# Patient Record
Sex: Female | Born: 2000
Health system: Southern US, Community
[De-identification: ages and names within clinical notes are randomized; demographics above are authoritative.]

## PROBLEM LIST (undated history)

## (undated) ENCOUNTER — Inpatient Hospital Stay (HOSPITAL_COMMUNITY): Payer: Self-pay

## (undated) DIAGNOSIS — O24419 Gestational diabetes mellitus in pregnancy, unspecified control: Secondary | ICD-10-CM

## (undated) DIAGNOSIS — L309 Dermatitis, unspecified: Secondary | ICD-10-CM

## (undated) DIAGNOSIS — F419 Anxiety disorder, unspecified: Secondary | ICD-10-CM

## (undated) DIAGNOSIS — F32A Depression, unspecified: Secondary | ICD-10-CM

## (undated) DIAGNOSIS — F429 Obsessive-compulsive disorder, unspecified: Secondary | ICD-10-CM

## (undated) HISTORY — DX: Dermatitis, unspecified: L30.9

## (undated) HISTORY — PX: NO PAST SURGERIES: SHX2092

## (undated) HISTORY — DX: Gestational diabetes mellitus in pregnancy, unspecified control: O24.419

---
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
## 2019-01-10 ENCOUNTER — Ambulatory Visit (INDEPENDENT_AMBULATORY_CARE_PROVIDER_SITE_OTHER): Payer: BC Managed Care – PPO | Admitting: Pediatric Endocrinology

## 2019-01-10 ENCOUNTER — Encounter (INDEPENDENT_AMBULATORY_CARE_PROVIDER_SITE_OTHER): Payer: Self-pay | Admitting: Pediatric Endocrinology

## 2019-01-10 ENCOUNTER — Other Ambulatory Visit: Payer: Self-pay

## 2019-01-10 VITALS — BP 122/76 | HR 90 | Wt 255.0 lb

## 2019-01-10 DIAGNOSIS — R7309 Other abnormal glucose: Secondary | ICD-10-CM | POA: Diagnosis not present

## 2019-01-10 DIAGNOSIS — Z8742 Personal history of other diseases of the female genital tract: Secondary | ICD-10-CM | POA: Diagnosis not present

## 2019-01-10 DIAGNOSIS — L83 Acanthosis nigricans: Secondary | ICD-10-CM

## 2019-01-10 DIAGNOSIS — N946 Dysmenorrhea, unspecified: Secondary | ICD-10-CM

## 2019-01-10 LAB — POCT GLUCOSE (DEVICE FOR HOME USE): Glucose Fasting, POC: 90 mg/dL (ref 70–99)

## 2019-01-10 LAB — POCT GLYCOSYLATED HEMOGLOBIN (HGB A1C): Hemoglobin A1C: 5.3 % (ref 4.0–5.6)

## 2019-01-10 NOTE — Patient Instructions (Signed)
Work on [employer]arry [employer] every day- you should aim to increase your heart rate multiple times a day   Try to limit sweet drinks to 1 per week.   Try to limit fast food to 1 per week   Learn to grill.

## 2019-01-10 NOTE — Progress Notes (Signed)
Subjective:  Subjective  Patient Name: Jackie Watkins See Date of Birth: June 15, 2001  MRN: [id number]  Jackie Watkins Schamp  presents to the office today for follow up evaluation and management of her primary amenorrhea  HISTORY OF PRESENT ILLNESS:   Jackie Watkins is a 18 y.o. AA female   Jackie Watkins was accompanied by her self  1. Jackie Watkins was seen by her PCP in September 2018 for her 16 year wcc. At that visit they discussed that she still had not started her period. She had LH/FSH labs checked which were normal at 7.1 and 6.2 respectively. TSH was normal as well. She was referred to endocrinology for further evaluation and management.    2. Jackie Watkins was last seen in [facility] on 07/12/18. In the interim she has been generally healthy.   She has continued to have regular menses since her last visit. She started a period today. She says that it is coming about once a month. It is still really heavy and painful. She is not taking Advil or motrin. She tries not to take too much medicine. She feels that she can deal with it.   She thinks that after her last visit she was doing a better job of caring for her body. She was going to the gym and exercising. With the pandemic and the closure of all the gyms she feels that she has struggled to maintain her goals. She is less active. She feels that she is starting to eat more at night. She feels that she is struggling with food choices. She notes that when she has her period she craves sweets or hot fries.   She dropped 2 sizes in her pants. She had to buy new shorts compared with last year.   She has not been doing any jumping jacks. She did 50 lunge jacks in clinic today. She did 70 jumping jacks (with breaks) at her last visit.    She is drinking water and some "pop". Mostly just water. She is getting fast food or carry out about twice a week. She usually gets a sweet tea.   Her family is still bringing a lot of healthy snacks in the house. Since they are all at home all day  her family has not been providing 'junk food".   Energy level is good.   She feels that her body hair is "way better". She is no longer getting extra hair on her face or chest.    3. Pertinent Review of Systems:  Constitutional: The patient feels "over emotional". The patient seems healthy and active.  Eyes: Vision seems to be good. There are no recognized eye problems. Neck: The patient has no complaints of anterior neck swelling, soreness, tenderness, pressure, discomfort, or difficulty swallowing.   Heart: Heart rate increases with exercise or other physical activity. The patient has no complaints of palpitations, irregular heart beats, chest pain, or chest pressure.   Lungs: asthma. Last rescue inhaler 1-2 weeks ago.  On Advair for maintenance- but not using regularly. No recent steroids. Not taking her Advair as prescribed.  Gastrointestinal: Bowel movents seem normal. The patient has no complaints of excessive hunger, acid reflux, upset stomach, stomach aches or pains, diarrhea, or constipation.  Legs: Muscle mass and strength seem normal. There are no complaints of numbness, tingling, burning, or pain. No edema is noted.  Feet: There are no obvious foot problems. There are no complaints of numbness, tingling, burning, or pain. No edema is noted. Neurologic: There are no recognized problems with  muscle movement and strength, sensation, or coordination. GYN/GU:  Menarche July 2019 (age 18). LMP 01/10/19  Getting a cycle about once a month.   PAST MEDICAL, FAMILY, AND SOCIAL HISTORY  Past Medical History:  Diagnosis Date  . Asthma   . Eczema     No family history on file.   Current Outpatient Medications:  .  albuterol (PROVENTIL HFA;VENTOLIN HFA) 108 (90 BASE) MCG/ACT inhaler, Inhale 2 puffs into the lungs every 6 (six) hours as needed. Shortness of breath/wheezing , Disp: , Rfl:  .  Fluticasone-Salmeterol (ADVAIR) 100-50 MCG/DOSE AEPB, Inhale 1 puff into the lungs every 12  (twelve) hours.  , Disp: , Rfl:  .  medroxyPROGESTERone (PROVERA) 10 MG tablet, Take 1 tablet (10 mg total) by mouth daily. (Patient not taking: Reported on 03/18/2018), Disp: 10 tablet, Rfl: 0 .  Multiple Vitamins-Minerals (EQL AIR PROTECTOR) TBEF, Take by mouth., Disp: , Rfl:   Allergies as of 01/10/2019  . (No Known Allergies)     reports that she is a non-smoker but has been exposed to tobacco smoke. She has never used smokeless tobacco. Pediatric History  Patient Parents  . Chamorro,Erica (Mother)  . Study,Tyrone (Father)   Other Topics Concern  . Not on file  Social History Narrative   Lives at home with step mom, dad, sister and two brothers attends [employer] is in the 11th grade     1. School and Family:  [employer]. Lives with dad, step mom,sister and 2 brothers. Bio mom is battling addiction.  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  2. Activities: not active. Feels that her asthma prevents her. Has had fewer asthma attacks since starting jumping jacks. 3. Primary Care Provider: Scifres, Dorothy, PA-C  ROS: There are no other significant problems involving Jeanice's other body systems.    Objective:  Objective  Vital Signs:  BP 122/76   Pulse 90   Wt 255 lb (115.7 kg)   BMI 38.51 kg/m    Ht Readings from Last 3 Encounters:  07/12/18 5' 8.23" (1.733 m) (94 %, Z= 1.57)*  03/18/18 5' 7.91" (1.725 m) (93 %, Z= 1.45)*  12/15/17 5' 8.7" (1.745 m) (96 %, Z= 1.77)*   * Growth percentiles are based on CDC (Girls, 2-20 Years) data.   Wt Readings from Last 3 Encounters:  01/10/19 255 lb (115.7 kg) (>99 %, Z= 2.50)*  07/12/18 258 lb 6.4 oz (117.2 kg) (>99 %, Z= 2.51)*  03/18/18 268 lb (121.6 kg) (>99 %, Z= 2.56)*   * Growth percentiles are based on CDC (Girls, 2-20 Years) data.   HC Readings from Last 3 Encounters:  No [facility]    PHYSICAL EXAM:  Constitutional: The patient appears healthy and well nourished. The patient's  height and weight are advanced for age. She has lost 3 pounds since last visit.  Head: The head is normocephalic. Face: The face appears normal. There are no obvious dysmorphic features. Eyes: The eyes appear to be normally formed and spaced. Gaze is conjugate. There is no obvious arcus or proptosis. Moisture appears normal. Ears: The ears are normally placed and appear externally normal. Mouth: The oropharynx and tongue appear normal. Dentition appears to be delayed for age. She still has some primary teeth.  Oral moisture is normal. Neck: The neck appears to be visibly normal.  The thyroid gland is 18 grams in size. The consistency of the thyroid gland is firm. The thyroid gland is not tender to  palpation. Trace acanthosis  Lungs: The lungs are clear to auscultation. Air movement is good. Heart: Heart rate and rhythm are regular. Heart sounds S1 and S2 are normal. I did not appreciate any pathologic cardiac murmurs. Abdomen: The abdomen appears to be enlarged in size for the patient's age. Bowel sounds are normal. There is no obvious hepatomegaly, splenomegaly, or other mass effect.  Arms: Muscle size and bulk are normal for age. Axillary acanthosis Hands: There is no obvious tremor. Phalangeal and metacarpophalangeal joints are normal. Palmar muscles are normal for age. Palmar skin is normal. Palmar moisture is also normal. Legs: Muscles appear normal for age. No edema is present. Feet: Feet are normally formed. Dorsalis pedal pulses are normal. Neurologic: Strength is normal for age in both the upper and lower extremities. Muscle tone is normal. Sensation to touch is normal in both the legs and feet.   Puberty:  Tanner stage breast/genital IV.  LAB DATA:   Results for orders placed or performed in visit on 01/10/19 (from the past 672 hour(s))  POCT Glucose (Device for Home Use)   Collection Time: 01/10/19  9:20 AM  Result Value Ref Range   Glucose Fasting, POC 90 70 - 99 mg/dL   POC  Glucose    POCT glycosylated hemoglobin (Hb A1C)   Collection Time: 01/10/19  9:31 AM  Result Value Ref Range   Hemoglobin A1C 5.3 4.0 - 5.6 %   HbA1c POC (<> result, manual entry)     HbA1c, POC (prediabetic range)     HbA1c, POC (controlled diabetic range)        A1C 5.8 -> 6.1 -> 5.7-> 5.4% -> 5.5% ->5.3  Assessment and Plan:  Assessment  ASSESSMENT: Saje is a 18 y.o. AA female referred for primary amenorrhea. She also has prediabetes.   Primary amenorrhea  Since her second provera challenge she has continued to have regular cycles.  They have continued to be heavy. Discussed options with pads, tampons, menstrual cups She still does not feel that they are severe enough to warrant starting OCP for regulation.   Pre diabetes  Since last visit she has struggled with her lifestyle interventions under quarantine Discussed options for exercise at home including options for working with light free weights She is still doing well with food/drink choices- but feels that it is more of a challenge. Discussed limiting sweet drinks and fast food to no more that 1x per week She has had continued weight loss and improved acanthosis  She is not longer having post prandial hyperphagia Her A1C is stable in the normal range  Morbid childhood obesity  She has lost another 3 pounds since last visit.    PLAN:  1. Diagnostic: A1C as above.  2. Therapeutic: Lifestyle changes.  3. Patient education: Lengthy discussion of the above. Questions answered.  4. Follow-up: Return in about 1 year (around 01/10/2020).      Lelon Huh, MD  Level of Service: This visit lasted in excess of 25 minutes. More than 50% of the visit was devoted to counseling.    Patient referred by Scifres, Earlie Server, PA-C for primary amenorrhea/ prediabetes  Copy of this note sent to Scifres, Earlie Server, PA-C

## 2019-01-26 DIAGNOSIS — J029 Acute pharyngitis, unspecified: Secondary | ICD-10-CM | POA: Diagnosis not present

## 2019-01-27 DIAGNOSIS — J029 Acute pharyngitis, unspecified: Secondary | ICD-10-CM | POA: Diagnosis not present

## 2019-03-23 DIAGNOSIS — Z0000 Encounter for general adult medical examination without abnormal findings: Secondary | ICD-10-CM | POA: Diagnosis not present

## 2019-03-23 DIAGNOSIS — Z113 Encounter for screening for infections with a predominantly sexual mode of transmission: Secondary | ICD-10-CM | POA: Diagnosis not present

## 2019-06-21 DIAGNOSIS — U071 COVID-19: Secondary | ICD-10-CM | POA: Diagnosis not present

## 2019-06-21 DIAGNOSIS — R430 Anosmia: Secondary | ICD-10-CM | POA: Diagnosis not present

## 2019-06-21 DIAGNOSIS — J069 Acute upper respiratory infection, unspecified: Secondary | ICD-10-CM | POA: Diagnosis not present

## 2019-06-21 DIAGNOSIS — Z20828 Contact with and (suspected) exposure to other viral communicable diseases: Secondary | ICD-10-CM | POA: Diagnosis not present

## 2019-06-21 DIAGNOSIS — Z3202 Encounter for pregnancy test, result negative: Secondary | ICD-10-CM | POA: Diagnosis not present

## 2019-06-27 IMAGING — US US PELVIS COMPLETE
2 series · 14 of 25 positions shown · non-contrast
Comparison: None.

CLINICAL DATA: Initial evaluation for primary amenorrhea.

EXAM:
TRANSABDOMINAL ULTRASOUND OF PELVIS
TECHNIQUE: Transabdominal ultrasound examination of the pelvis was performed
including evaluation of the uterus, ovaries, adnexal regions, and
pelvic cul-de-sac.

[Series 1: us pelvis complete · 0.25mm/px · 13 of 28 slices shown (1 of 2)]
[im 1/28]
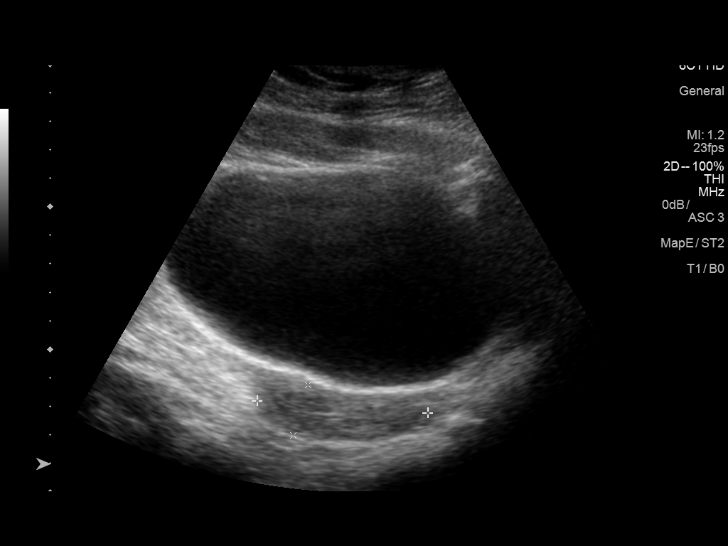
[im 3/28]
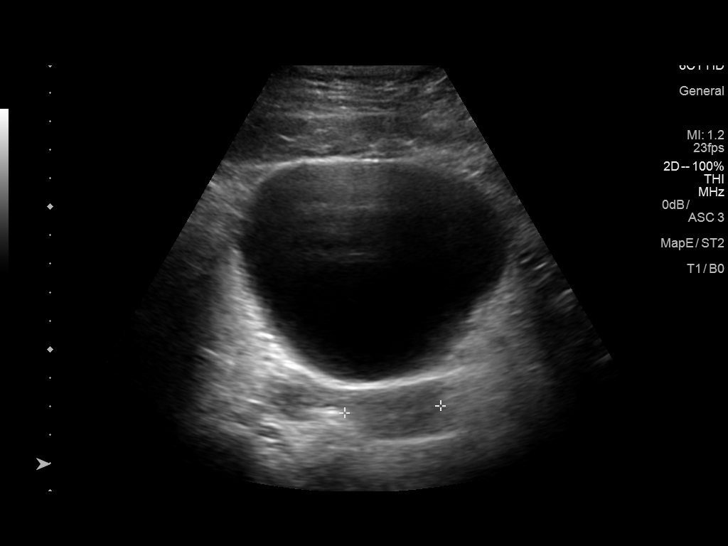
[im 5/28]
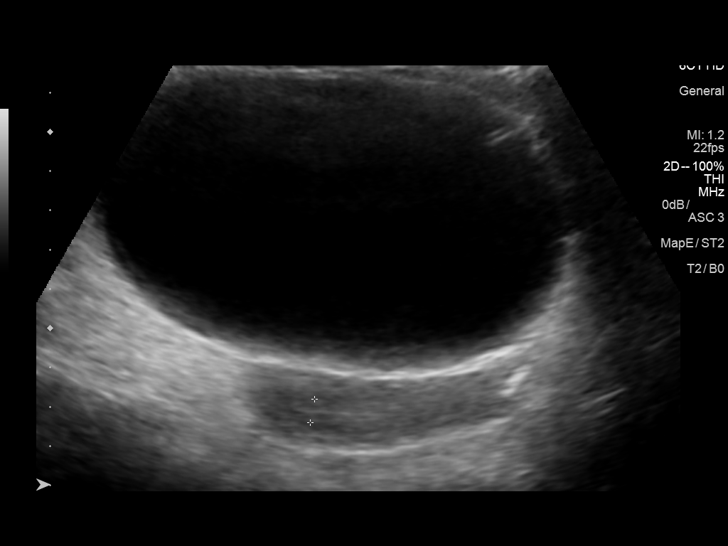
[im 8/28]
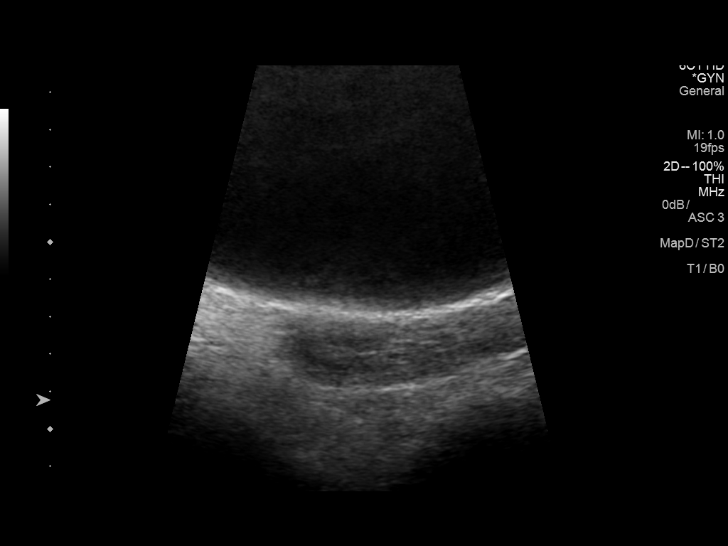
[im 10/28]
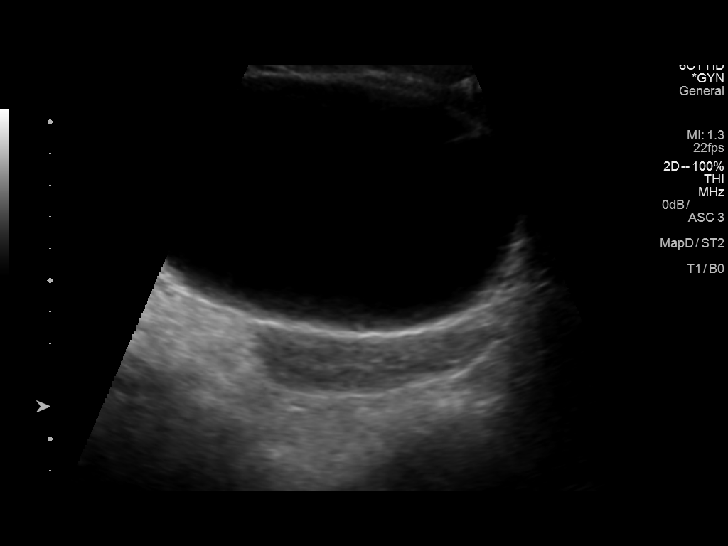
[im 12/28]
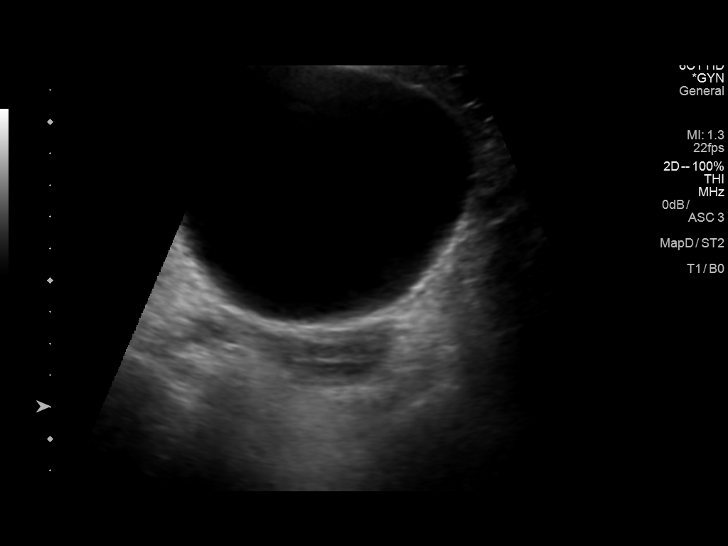
[im 14/28]
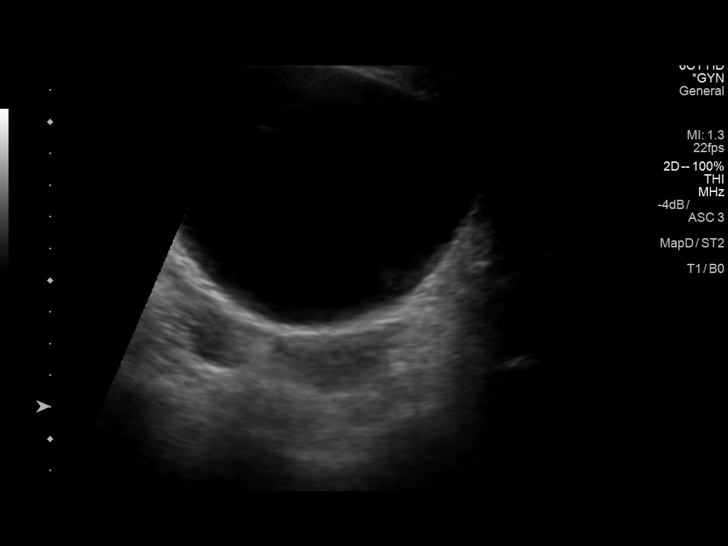
[im 16/28]
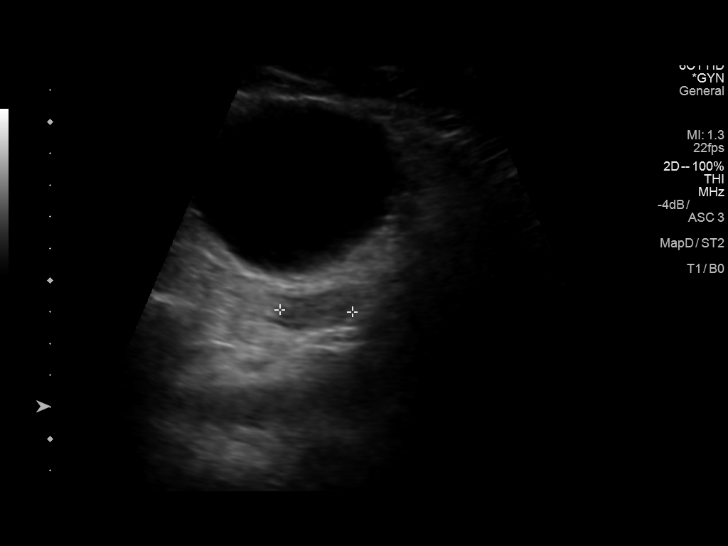
[im 19/28]
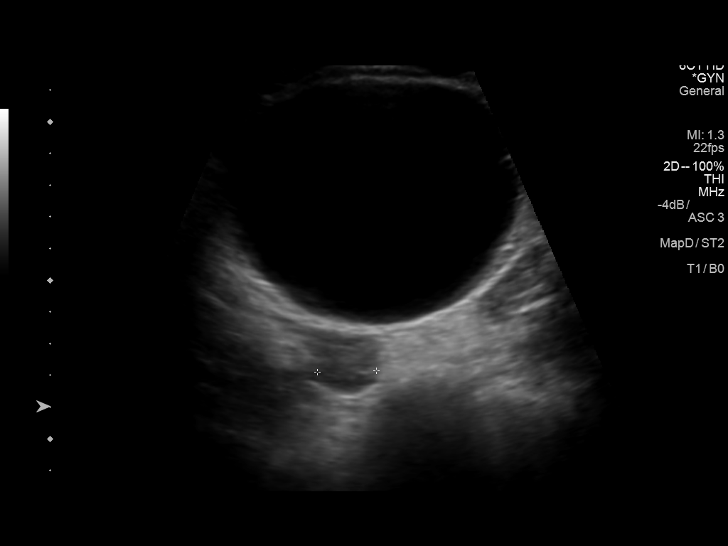
[im 20/28]
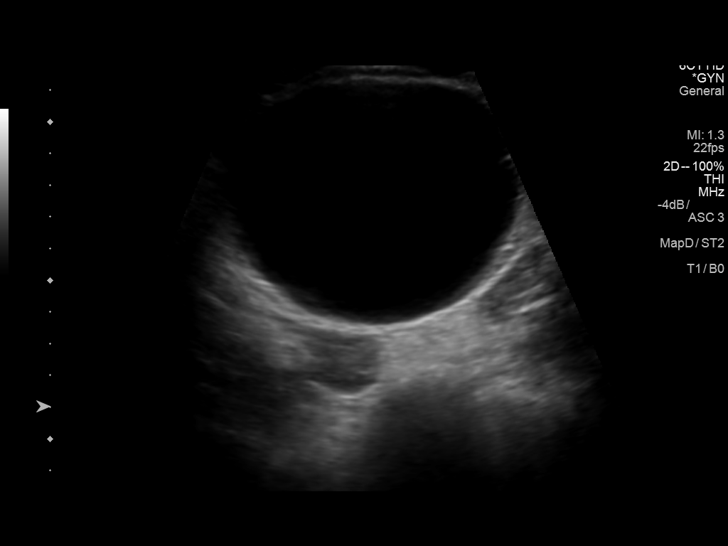
[im 23/28]
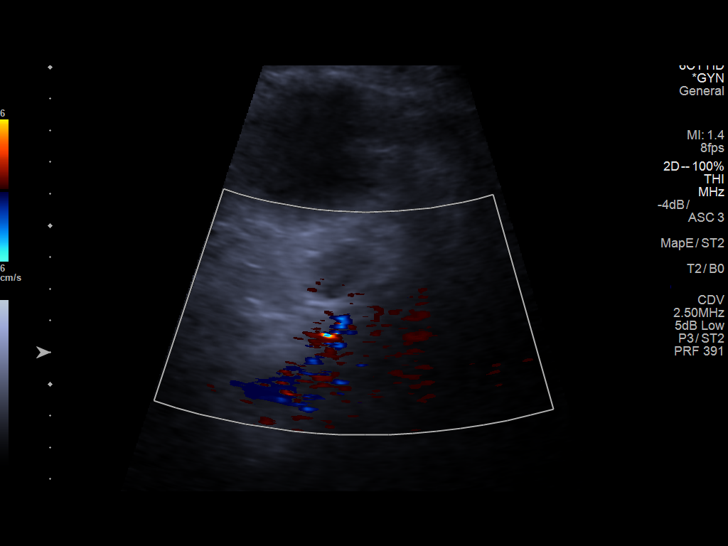
[im 25/28]
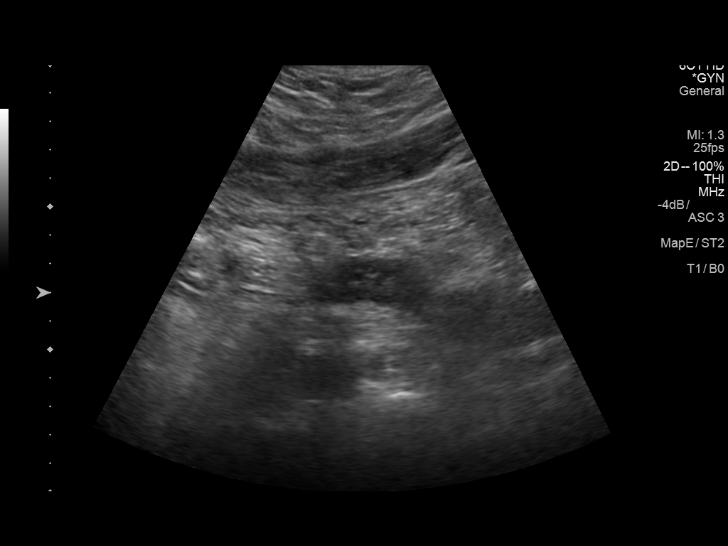
[im 28/28]
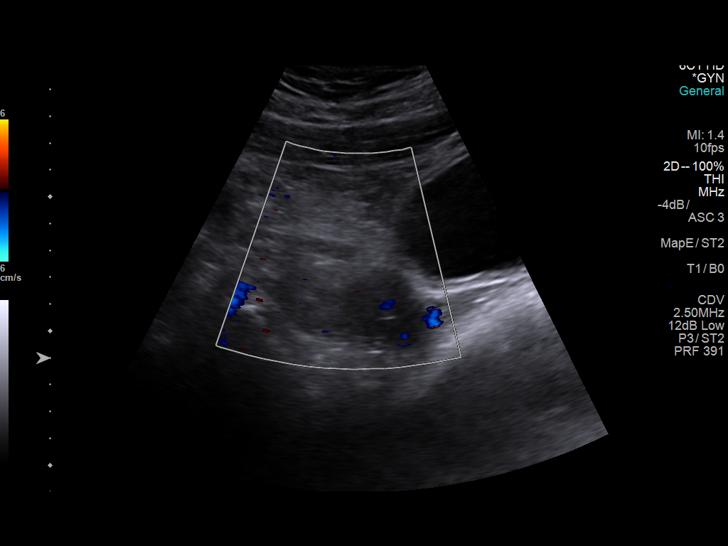

[Series 2: us pelvis complete · 0.25mm/px · 1 of 2 slices shown (2 of 2)]
[im 2/2]
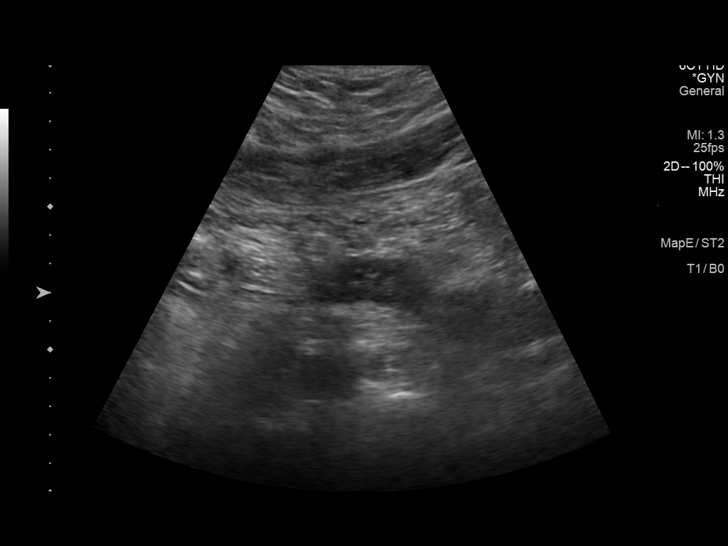

[14 of 25 positions shown; findings below may reference images not displayed]

FINDINGS: Uterus

Measurements: 6.0 x 1.8 x 3.4 cm. No fibroids or other mass
visualized.

Endometrium

Thickness: 6.1 mm.  No focal abnormality visualized.

Right ovary

Measurements: 3.6 x 1.8 x 1.9 cm. Normal appearance/no adnexal mass.

Left ovary

Measurements: 4.3 x 1.8 x 2.3 cm. Normal appearance/no adnexal mass.

Other findings:  No abnormal free fluid.
IMPRESSION: Normal pelvic ultrasound.

## 2020-04-24 DIAGNOSIS — G510 Bell's palsy: Secondary | ICD-10-CM | POA: Diagnosis not present

## 2020-10-26 DIAGNOSIS — J4521 Mild intermittent asthma with (acute) exacerbation: Secondary | ICD-10-CM | POA: Diagnosis not present

## 2020-10-26 DIAGNOSIS — J302 Other seasonal allergic rhinitis: Secondary | ICD-10-CM | POA: Diagnosis not present

## 2020-12-28 DIAGNOSIS — J302 Other seasonal allergic rhinitis: Secondary | ICD-10-CM | POA: Diagnosis not present

## 2020-12-28 DIAGNOSIS — J4530 Mild persistent asthma, uncomplicated: Secondary | ICD-10-CM | POA: Diagnosis not present

## 2021-02-14 DIAGNOSIS — O26899 Other specified pregnancy related conditions, unspecified trimester: Secondary | ICD-10-CM | POA: Diagnosis not present

## 2021-02-20 LAB — OB RESULTS CONSOLE HEPATITIS B SURFACE ANTIGEN: Hepatitis B Surface Ag: NEGATIVE

## 2021-02-20 LAB — OB RESULTS CONSOLE RUBELLA ANTIBODY, IGM: Rubella: IMMUNE

## 2021-02-20 LAB — HEPATITIS C ANTIBODY: HCV Ab: NEGATIVE

## 2021-02-20 LAB — OB RESULTS CONSOLE ABO/RH: RH Type: NEGATIVE

## 2021-02-20 LAB — OB RESULTS CONSOLE GC/CHLAMYDIA
Chlamydia: NEGATIVE
Gonorrhea: NEGATIVE

## 2021-02-20 LAB — OB RESULTS CONSOLE ANTIBODY SCREEN: Antibody Screen: NEGATIVE

## 2021-02-20 LAB — OB RESULTS CONSOLE RPR: RPR: NONREACTIVE

## 2021-02-20 LAB — OB RESULTS CONSOLE HIV ANTIBODY (ROUTINE TESTING): HIV: NONREACTIVE

## 2021-02-22 DIAGNOSIS — Z3401 Encounter for supervision of normal first pregnancy, first trimester: Secondary | ICD-10-CM | POA: Diagnosis not present

## 2021-02-22 DIAGNOSIS — Z124 Encounter for screening for malignant neoplasm of cervix: Secondary | ICD-10-CM | POA: Diagnosis not present

## 2021-02-22 DIAGNOSIS — E669 Obesity, unspecified: Secondary | ICD-10-CM | POA: Diagnosis not present

## 2021-02-22 DIAGNOSIS — Z3490 Encounter for supervision of normal pregnancy, unspecified, unspecified trimester: Secondary | ICD-10-CM | POA: Diagnosis not present

## 2021-02-22 DIAGNOSIS — Z3400 Encounter for supervision of normal first pregnancy, unspecified trimester: Secondary | ICD-10-CM | POA: Diagnosis not present

## 2021-03-25 DIAGNOSIS — Z3401 Encounter for supervision of normal first pregnancy, first trimester: Secondary | ICD-10-CM | POA: Diagnosis not present

## 2021-03-25 DIAGNOSIS — Z315 Encounter for genetic counseling: Secondary | ICD-10-CM | POA: Diagnosis not present

## 2021-03-25 DIAGNOSIS — Z3482 Encounter for supervision of other normal pregnancy, second trimester: Secondary | ICD-10-CM | POA: Diagnosis not present

## 2021-03-26 ENCOUNTER — Other Ambulatory Visit: Payer: Self-pay

## 2021-03-26 ENCOUNTER — Emergency Department (HOSPITAL_COMMUNITY): Payer: BC Managed Care – PPO

## 2021-03-26 ENCOUNTER — Emergency Department (HOSPITAL_COMMUNITY)
Admission: EM | Admit: 2021-03-26 | Discharge: 2021-03-27 | Disposition: A | Discharge status: Home / Self Care | Payer: BC Managed Care – PPO | Attending: Emergency Medicine | Admitting: Emergency Medicine

## 2021-03-26 ENCOUNTER — Encounter (HOSPITAL_COMMUNITY): Payer: Self-pay | Admitting: Emergency Medicine

## 2021-03-26 DIAGNOSIS — O4690 Antepartum hemorrhage, unspecified, unspecified trimester: Secondary | ICD-10-CM

## 2021-03-26 DIAGNOSIS — O468X1 Other antepartum hemorrhage, first trimester: Secondary | ICD-10-CM

## 2021-03-26 DIAGNOSIS — J45909 Unspecified asthma, uncomplicated: Secondary | ICD-10-CM | POA: Diagnosis not present

## 2021-03-26 DIAGNOSIS — Z3A13 13 weeks gestation of pregnancy: Secondary | ICD-10-CM | POA: Insufficient documentation

## 2021-03-26 DIAGNOSIS — D72829 Elevated white blood cell count, unspecified: Secondary | ICD-10-CM | POA: Insufficient documentation

## 2021-03-26 DIAGNOSIS — Z7951 Long term (current) use of inhaled steroids: Secondary | ICD-10-CM | POA: Diagnosis not present

## 2021-03-26 DIAGNOSIS — Z7722 Contact with and (suspected) exposure to environmental tobacco smoke (acute) (chronic): Secondary | ICD-10-CM | POA: Insufficient documentation

## 2021-03-26 DIAGNOSIS — O208 Other hemorrhage in early pregnancy: Secondary | ICD-10-CM | POA: Diagnosis not present

## 2021-03-26 DIAGNOSIS — N939 Abnormal uterine and vaginal bleeding, unspecified: Secondary | ICD-10-CM | POA: Diagnosis not present

## 2021-03-26 DIAGNOSIS — O418X10 Other specified disorders of amniotic fluid and membranes, first trimester, not applicable or unspecified: Secondary | ICD-10-CM

## 2021-03-26 LAB — CBC WITH DIFFERENTIAL/PLATELET
Abs Immature Granulocytes: 0.05 10*3/uL (ref 0.00–0.07)
Basophils Absolute: 0 10*3/uL (ref 0.0–0.1)
Basophils Relative: 0 %
Eosinophils Absolute: 0.1 10*3/uL (ref 0.0–0.5)
Eosinophils Relative: 1 %
HCT: 36.5 % (ref 36.0–46.0)
Hemoglobin: 11.6 g/dL — ABNORMAL LOW (ref 12.0–15.0)
Immature Granulocytes: 0 %
Lymphocytes Relative: 13 %
Lymphs Abs: 1.7 10*3/uL (ref 0.7–4.0)
MCH: 24.7 pg — ABNORMAL LOW (ref 26.0–34.0)
MCHC: 31.8 g/dL (ref 30.0–36.0)
MCV: 77.7 fL — ABNORMAL LOW (ref 80.0–100.0)
Monocytes Absolute: 1.2 10*3/uL — ABNORMAL HIGH (ref 0.1–1.0)
Monocytes Relative: 9 %
Neutro Abs: 10.3 10*3/uL — ABNORMAL HIGH (ref 1.7–7.7)
Neutrophils Relative %: 77 %
Platelets: 443 10*3/uL — ABNORMAL HIGH (ref 150–400)
RBC: 4.7 MIL/uL (ref 3.87–5.11)
RDW: 14.9 % (ref 11.5–15.5)
WBC: 13.4 10*3/uL — ABNORMAL HIGH (ref 4.0–10.5)
nRBC: 0 % (ref 0.0–0.2)

## 2021-03-26 LAB — URINALYSIS, ROUTINE W REFLEX MICROSCOPIC
Bilirubin Urine: NEGATIVE
Glucose, UA: NEGATIVE mg/dL
Hgb urine dipstick: NEGATIVE
Ketones, ur: 20 mg/dL — AB
Leukocytes,Ua: NEGATIVE
Nitrite: NEGATIVE
Protein, ur: NEGATIVE mg/dL
Specific Gravity, Urine: 1.023 (ref 1.005–1.030)
pH: 6 (ref 5.0–8.0)

## 2021-03-26 LAB — BASIC METABOLIC PANEL
Anion gap: 10 (ref 5–15)
BUN: 7 mg/dL (ref 6–20)
CO2: 23 mmol/L (ref 22–32)
Calcium: 9.2 mg/dL (ref 8.9–10.3)
Chloride: 102 mmol/L (ref 98–111)
Creatinine, Ser: 0.59 mg/dL (ref 0.44–1.00)
GFR, Estimated: >60 mL/min (ref >60)
Glucose, Bld: 74 mg/dL (ref 70–99)
Potassium: 3.6 mmol/L (ref 3.5–5.1)
Sodium: 135 mmol/L (ref 135–145)

## 2021-03-26 LAB — ABO/RH: ABO/RH(D): O NEG

## 2021-03-26 LAB — HCG, QUANTITATIVE, PREGNANCY: hCG, Beta Chain, Quant, S: 143925 m[IU]/mL — ABNORMAL HIGH (ref <5)

## 2021-03-26 MED ORDER — RHO D IMMUNE GLOBULIN 1500 UNIT/2ML IJ SOSY
300.0000 ug | PREFILLED_SYRINGE | Freq: Once | INTRAMUSCULAR | Status: DC
  Filled 2021-03-26: qty 2

## 2021-03-26 NOTE — ED Provider Notes (Signed)
Emergency Medicine Provider Triage Evaluation Note  Jackie Watkins , a 20 y.o. female  was evaluated in triage.  Pt complains of vaginal bleeding.  G1, P0 first day of LMP July 2 who presents today for evaluation of blood clot.  She is states that she went to use the bathroom and urinate and passed a baseball sized blood clot.  She does not take any blood thinning medications.  She has not had any bleeding this pregnancy.  She denies any cramping.  She has had a ultrasound so far this pregnancy, sees [facility] and it was normal.   Review of Systems  Positive: Vaginal bleeding,  Negative: cramping  Physical Exam  BP (!) 155/83   Pulse 91   Temp 98.8 F (37.1 C) (Oral)   Resp 18   Ht 5\' 8"  (1.727 m)   Wt (!) 137 kg   SpO2 100%   BMI 45.92 kg/m  Gen:   Awake, no distress   Resp:  Normal effort  MSK:   Moves extremities without difficulty  Other:  Awake and alert, speech is not slurred.  Medical Decision Making  Medically screening exam initiated at 5:55 PM.  Appropriate orders placed.  Ailed Defibaugh was informed that the remainder of the evaluation will be completed by another provider, this initial triage assessment does not replace that evaluation, and the importance of remaining in the ED until their evaluation is complete.  Ordered lab work, patient reports her blood type is O-, will order ABO Rh to confirm if she may require RhoGAM.  Ordered ultrasound.  Note: Portions of this report may have been transcribed using voice recognition software. Every effort was made to ensure accuracy; however, inadvertent computerized transcription errors may be present    Gwenlyn Saran, PA-C 03/26/21 1806    05/26/21, DO 03/26/21 2141

## 2021-03-26 NOTE — ED Notes (Signed)
Lab states they are unable to see Rhogam order so they cannot release medication. Explained delay to patient and family.

## 2021-03-26 NOTE — Discharge Instructions (Addendum)
Contact a health care provider if: You have any vaginal bleeding. You have a fever. Get help right away if: You have severe cramps in your stomach, back, abdomen, or pelvis. You pass large clots or tissue. Save any tissue for your health care provider to look at. You faint. You become light-headed or weak.

## 2021-03-26 NOTE — ED Triage Notes (Signed)
Patient states she is [redacted] weeks pregnant and had passed a large clot after urinating today, has picture on phone. Denies current pain, N/V.

## 2021-03-26 NOTE — ED Provider Notes (Signed)
[facility]-EMERGENCY DEPT Provider Note   CSN: [id number] Arrival date & time: 03/26/21  1732     History Chief Complaint  Patient presents with   Vaginal Bleeding    Jackie Watkins is a 20 y.o. female G1, P0 female [redacted] weeks pregnant.  Patient passed a clot earlier today.  No pain or cramping.  She denies any vaginal symptoms, nausea, vomiting diarrhea or abdominal pain at this time.  She has had no trauma to the abdomen.  She has no urinary symptoms.   Vaginal Bleeding     Past Medical History:  Diagnosis Date   Asthma    Eczema     Patient Active Problem List   Diagnosis Date Noted   Dysmenorrhea 01/10/2019   History of abnormal menstrual cycle 01/10/2019   Acanthosis 07/12/2018   Thyroid goiter 03/18/2018   Elevated testosterone level 06/10/2017    History reviewed. No pertinent surgical history.   OB History     Gravida  1   Para      Term      Preterm      AB      Living         SAB      IAB      Ectopic      Multiple      Live Births              No family history on file.  Social History   Tobacco Use   Smoking status: Passive Smoke Exposure - Never Smoker   Smokeless tobacco: Never    Home Medications Prior to Admission medications   Medication Sig Start Date End Date Taking? Authorizing Provider  albuterol (PROVENTIL HFA;VENTOLIN HFA) 108 (90 BASE) MCG/ACT inhaler Inhale 2 puffs into the lungs every 6 (six) hours as needed. Shortness of breath/wheezing     [provider]  Fluticasone-Salmeterol (ADVAIR) 100-50 MCG/DOSE AEPB Inhale 1 puff into the lungs every 12 (twelve) hours.      [provider]  medroxyPROGESTERone (PROVERA) 10 MG tablet Take 1 tablet (10 mg total) by mouth daily. Patient not taking: Reported on 03/18/2018 12/15/17   Dessa Phi, MD  Multiple Vitamins-Minerals (EQL AIR PROTECTOR) TBEF Take by mouth.    [provider]    Allergies    Patient has no  known allergies.  Review of Systems   Review of Systems  Genitourinary:  Positive for vaginal bleeding.  Ten systems reviewed and are negative for acute change, except as noted in the HPI.   Physical Exam Updated Vital Signs BP (!) 150/73 (BP Location: Right Arm)   Pulse 85   Temp 98.8 F (37.1 C) (Oral)   Resp 18   Ht 5\' 8"  (1.727 m)   Wt (!) 137 kg   SpO2 100%   BMI 45.92 kg/m   Physical Exam Vitals and nursing note reviewed.  Constitutional:      General: She is not in acute distress.    Appearance: She is well-developed. She is not diaphoretic.  HENT:     Head: Normocephalic and atraumatic.     Right Ear: External ear normal.     Left Ear: External ear normal.     Nose: Nose normal.     Mouth/Throat:     Mouth: Mucous membranes are moist.  Eyes:     General: No scleral icterus.    Conjunctiva/sclera: Conjunctivae normal.  Cardiovascular:     Rate and Rhythm: Normal rate and  regular rhythm.     Heart sounds: Normal heart sounds. No murmur heard.   No friction rub. No gallop.  Pulmonary:     Effort: Pulmonary effort is normal. No respiratory distress.     Breath sounds: Normal breath sounds.  Abdominal:     General: Bowel sounds are normal. There is no distension.     Palpations: Abdomen is soft. There is no mass.     Tenderness: There is no abdominal tenderness. There is no guarding.  Musculoskeletal:     Cervical back: Normal range of motion.  Skin:    General: Skin is warm and dry.  Neurological:     Mental Status: She is alert and oriented to person, place, and time.  Psychiatric:        Behavior: Behavior normal.    ED Results / Procedures / Treatments   Labs (all labs ordered are listed, but only abnormal results are displayed) Labs Reviewed  CBC WITH DIFFERENTIAL/PLATELET - Abnormal; Notable for the following components:      Result Value   WBC 13.4 (*)    Hemoglobin 11.6 (*)    MCV 77.7 (*)    MCH 24.7 (*)    Platelets 443 (*)    Neutro Abs  10.3 (*)    Monocytes Absolute 1.2 (*)    All other components within normal limits  URINALYSIS, ROUTINE W REFLEX MICROSCOPIC - Abnormal; Notable for the following components:   Ketones, ur 20 (*)    All other components within normal limits  HCG, QUANTITATIVE, PREGNANCY - Abnormal; Notable for the following components:   hCG, Beta Chain, Quant, S [id number] (*)    All other components within normal limits  BASIC METABOLIC PANEL  ABO/RH  RH IG WORKUP (INCLUDES ABO/RH)    EKG None  Radiology US OB Comp < 14 Wks  Result Date: 03/26/2021 CLINICAL DATA:  Thirteen weeks pregnant. Vaginal bleeding. Last menstrual period 01/11/2021. Estimated due date 09/28/2021. Gestational age by last menstrual. 13 weeks and 3 days. Quantitative beta HCG [id number] EXAM: OBSTETRIC <14 WK ULTRASOUND TECHNIQUE: Transabdominal ultrasound was performed for evaluation of the gestation as well as the maternal uterus and adnexal regions. COMPARISON:  None. FINDINGS: Intrauterine gestational sac: Single Yolk sac:  Not Visualized. Embryo:  Visualized. Cardiac Activity: Visualized. Heart Rate: 164 bpm CRL:   68.1 mm   13 w 0 d                  [place] EDC: 10/01/2021 Subchorionic hemorrhage:  Small. Maternal uterus/adnexae: Bilateral ovaries are not visualized. Uterus is otherwise unremarkable. Other: No free fluid within the pelvis. IMPRESSION: 1. Single live intrauterine pregnancy with gestational age by ultrasound of 13 weeks and 0 days which is concordant with gestational age by last menstrual period of 13 weeks and 3 days. 2. Small subchorionic hemorrhage. Electronically Signed   By: Tish Frederickson M.D.   On: 03/26/2021 20:36    Procedures Procedures   Medications Ordered in ED Medications  rho (d) immune globulin (RHIG/RHOPHYLAC) injection 300 mcg (300 mcg Intramuscular Given 03/27/21 0052)    ED Course  I have reviewed the triage vital signs and the nursing notes.  Pertinent labs & imaging results that were available  during my care of the patient were reviewed by me and considered in my medical decision making (see chart for details).  Clinical Course as of 03/27/21 1659  Tue Mar 26, 2021  2136 US OB Comp < 14 Wks [AH]  Clinical Course User Index [AH] Arthor Captain, PA-C   MDM Rules/Calculators/A&P                           Patient here with bleeding in early pregnancy. The differential diagnosis for vaginal bleeding in pregnancy less than 20 weeks includes but is not limited to the following: Ectopic pregnancy, Subchorionic hematoma, First Trimester Abortion, Gestational trophoblastic disease, Heterotopic pregnancy, Implantation bleeding, Molar pregnancy, Cervicitis, Fibroids, Vaginal Trauma I ordered and reviewed labs that include cbc with mildly elevated leukocytosis, hgb slightly low. Bmp, UA are unremarkable blood type O-, RhoGam administered. OB US shows small subchorionic hemorrhage.  Advised to avoid heavy lifting and nothing PV. Follow up asap with OB provider. Discussed return precautions  Final Clinical Impression(s) / ED Diagnoses Final diagnoses:  Subchorionic hematoma in first trimester, single or unspecified fetus  Vaginal bleeding in pregnancy    Rx / DC Orders ED Discharge Orders     None        Arthor Captain, PA-C 03/27/21 1659    Milagros Loll, MD 03/28/21 1144

## 2021-03-27 DIAGNOSIS — O208 Other hemorrhage in early pregnancy: Secondary | ICD-10-CM | POA: Diagnosis not present

## 2021-03-27 MED ORDER — RHO D IMMUNE GLOBULIN 1500 UNIT/2ML IJ SOSY
300.0000 ug | PREFILLED_SYRINGE | Freq: Once | INTRAMUSCULAR | Status: AC
  Administered 2021-03-27: 300 ug via INTRAMUSCULAR
  Filled 2021-03-27: qty 2

## 2021-03-28 LAB — RH IG WORKUP (INCLUDES ABO/RH)
ABO/RH(D): O NEG
Antibody Screen: NEGATIVE
Gestational Age(Wks): 13
Unit division: 0
Unit tag comment: 13

## 2021-04-25 DIAGNOSIS — Z3490 Encounter for supervision of normal pregnancy, unspecified, unspecified trimester: Secondary | ICD-10-CM | POA: Diagnosis not present

## 2021-05-08 DIAGNOSIS — Z3402 Encounter for supervision of normal first pregnancy, second trimester: Secondary | ICD-10-CM | POA: Diagnosis not present

## 2021-05-08 DIAGNOSIS — O99212 Obesity complicating pregnancy, second trimester: Secondary | ICD-10-CM | POA: Diagnosis not present

## 2021-05-08 DIAGNOSIS — Z36 Encounter for antenatal screening for chromosomal anomalies: Secondary | ICD-10-CM | POA: Diagnosis not present

## 2021-05-10 DIAGNOSIS — R7309 Other abnormal glucose: Secondary | ICD-10-CM | POA: Diagnosis not present

## 2021-05-29 ENCOUNTER — Other Ambulatory Visit: Payer: Self-pay

## 2021-05-29 ENCOUNTER — Encounter: Payer: BC Managed Care – PPO | Attending: Obstetrics and Gynecology | Admitting: Registered"

## 2021-05-29 DIAGNOSIS — O24419 Gestational diabetes mellitus in pregnancy, unspecified control: Secondary | ICD-10-CM | POA: Diagnosis not present

## 2021-05-30 ENCOUNTER — Encounter: Payer: Self-pay | Admitting: Registered"

## 2021-05-30 NOTE — Progress Notes (Signed)
Patient was seen on 05/29/21 for Gestational Diabetes self-management class at the [facility]. The following learning objectives were met by the patient during this course:  States the definition of Gestational Diabetes States why dietary management is important in controlling blood glucose Describes the effects each nutrient has on blood glucose levels Demonstrates ability to create a balanced meal plan Demonstrates carbohydrate counting  States when to check blood glucose levels Demonstrates proper blood glucose monitoring techniques States the effect of stress and exercise on blood glucose levels States the importance of limiting caffeine and abstaining from alcohol and smoking  Blood glucose monitor given: Patient has meter and is checking blood sugar prior to class   Patient instructed to monitor glucose levels: FBS: 60 - <95; 1 hour: <140; 2 hour: <120  Patient received handouts: Nutrition Diabetes and Pregnancy, including carb counting list  Patient will be seen for follow-up as needed.

## 2021-06-20 DIAGNOSIS — O26842 Uterine size-date discrepancy, second trimester: Secondary | ICD-10-CM | POA: Diagnosis not present

## 2021-06-23 NOTE — L&D Delivery Note (Signed)
Delivery Note ?Arrived to room to evaluate for cervical change. Patient was found to be complete and 0/+1 station. Practice pushed completed in the bed with good fetal descent. Bed was broken down. After multiple pushes, fetal head was delivered over intact perineum. Nuchal x 1 was present - unable to reduce at perineum. Shoulders and body followed without difficulty. Infant was placed on mother's chest, mouth and nose were bulb suctioned and let out a vigorous cry. Delayed cord clamping was performed for 60 seconds. Venous cord blood and arterial gases were collected. Placenta delivered spontaneously. Cervix, vagina, perineum and labia were inspected and a periurethral tear on the right was present and bleeding. An in-and-out catheter was placed for urethra identification. The periurethral tear was repaired in a running fashion. using 4-0 Vicryl. Adequate hemostasis was noted. Uterus fundus firm. Placenta was inspected, found to be intact with a 3 vessel cord. Counts were correct.  ? ?At 1:13 AM a viable and healthy female was delivered via Vaginal, Spontaneous (Presentation: Left Occiput Anterior).  APGAR: 8, 9; weight: see infant's chart ?Placenta status: Spontaneous, Intact.  Cord: 3 vessels with the following complications: None.  Cord pH: collected ? ?Anesthesia: Epidural ?Episiotomy: None ?Lacerations: Periurethral ?Suture Repair:  4.0 Vicryl ?Est. Blood Loss (mL): 300 ? ?Mom to postpartum.  Baby to Couplet care / Skin to Skin. ? ?Drema Dallas ?09/22/2021, 1:31 AM ? ? ? ?

## 2021-07-10 DIAGNOSIS — N898 Other specified noninflammatory disorders of vagina: Secondary | ICD-10-CM | POA: Diagnosis not present

## 2021-07-12 DIAGNOSIS — R300 Dysuria: Secondary | ICD-10-CM | POA: Diagnosis not present

## 2021-07-12 DIAGNOSIS — Z113 Encounter for screening for infections with a predominantly sexual mode of transmission: Secondary | ICD-10-CM | POA: Diagnosis not present

## 2021-07-12 DIAGNOSIS — Z0000 Encounter for general adult medical examination without abnormal findings: Secondary | ICD-10-CM | POA: Diagnosis not present

## 2021-07-12 DIAGNOSIS — N898 Other specified noninflammatory disorders of vagina: Secondary | ICD-10-CM | POA: Diagnosis not present

## 2021-07-18 DIAGNOSIS — Z3490 Encounter for supervision of normal pregnancy, unspecified, unspecified trimester: Secondary | ICD-10-CM | POA: Diagnosis not present

## 2021-07-18 DIAGNOSIS — Z3403 Encounter for supervision of normal first pregnancy, third trimester: Secondary | ICD-10-CM | POA: Diagnosis not present

## 2021-07-18 DIAGNOSIS — Z23 Encounter for immunization: Secondary | ICD-10-CM | POA: Diagnosis not present

## 2021-07-18 DIAGNOSIS — O24419 Gestational diabetes mellitus in pregnancy, unspecified control: Secondary | ICD-10-CM | POA: Diagnosis not present

## 2021-07-18 DIAGNOSIS — O99213 Obesity complicating pregnancy, third trimester: Secondary | ICD-10-CM | POA: Diagnosis not present

## 2021-08-16 DIAGNOSIS — O24419 Gestational diabetes mellitus in pregnancy, unspecified control: Secondary | ICD-10-CM | POA: Diagnosis not present

## 2021-08-19 ENCOUNTER — Encounter: Payer: Self-pay | Admitting: *Deleted

## 2021-08-20 ENCOUNTER — Other Ambulatory Visit: Payer: Self-pay | Admitting: Obstetrics and Gynecology

## 2021-08-20 ENCOUNTER — Ambulatory Visit (HOSPITAL_BASED_OUTPATIENT_CLINIC_OR_DEPARTMENT_OTHER): Payer: BC Managed Care – PPO | Admitting: Maternal & Fetal Medicine

## 2021-08-20 ENCOUNTER — Encounter: Payer: Self-pay | Admitting: *Deleted

## 2021-08-20 ENCOUNTER — Ambulatory Visit: Payer: BC Managed Care – PPO | Attending: Pediatrics

## 2021-08-20 ENCOUNTER — Ambulatory Visit: Payer: BC Managed Care – PPO | Admitting: *Deleted

## 2021-08-20 ENCOUNTER — Other Ambulatory Visit: Payer: Self-pay

## 2021-08-20 VITALS — BP 129/69 | HR 100

## 2021-08-20 DIAGNOSIS — O24410 Gestational diabetes mellitus in pregnancy, diet controlled: Secondary | ICD-10-CM | POA: Diagnosis not present

## 2021-08-20 DIAGNOSIS — Z363 Encounter for antenatal screening for malformations: Secondary | ICD-10-CM | POA: Insufficient documentation

## 2021-08-20 DIAGNOSIS — Z364 Encounter for antenatal screening for fetal growth retardation: Secondary | ICD-10-CM

## 2021-08-20 DIAGNOSIS — O365910 Maternal care for other known or suspected poor fetal growth, first trimester, not applicable or unspecified: Secondary | ICD-10-CM | POA: Insufficient documentation

## 2021-08-20 DIAGNOSIS — Z3A34 34 weeks gestation of pregnancy: Secondary | ICD-10-CM

## 2021-08-20 DIAGNOSIS — O365930 Maternal care for other known or suspected poor fetal growth, third trimester, not applicable or unspecified: Secondary | ICD-10-CM | POA: Diagnosis not present

## 2021-08-20 NOTE — Progress Notes (Signed)
MFM Brief Note  Jackie Watkins is 21 yo G1P0 at [place] 3 d with an EDD of 09/28/21 based on LMP consistent with a [redacted]w[redacted]d ultrasound.   She is seen today at the request of Dr. Drema Dallas with [facility]   Her pregnancy is complicated by [id number] and elevated BMI of 44 kg/m3. She reports a low risk NIPS and screen positive for SMA howevere her partner is negative.   Single intrauterine pregnancy here for a detailed anatomy due to outpatient study concerned for fetal growth restriction with EFW 10%.  Normal anatomy with measurements consistent with dates but small for gestational with an EFW of 13% and AC of 18% There is good fetal movement and amniotic fluid volume Suboptimal views of the fetal anatomy were obtained secondary to fetal position and advance gestation.   I discussed today's visit with a diagnosis of IUGR. I explained that the etiology includes placental insufficiency, chronic disease, infection, aneuploidy and other genetic syndromes. She has a low risk NIPS . She has no additional risk factors for chronic disease with exception of elevated BMI.  At this time I explained the diagnosis, evaluation and management to include on going fetal growth and weekly antenatal testing to include UA Dopplers, IF the EFW or AC became less than the 10th%.   Consider initiating weeky testing at the time of diagnosis to include UA Dopplers, BPP or NST.  Delivery should be accomplished between 37 and 39 weeks.   If the UA Dopplers become abnormal consider consultation with MFM.   All questions answered.  I spent 30 minutes with > 50% in face to face consultation.  Vikki Ports, MD

## 2021-08-27 ENCOUNTER — Ambulatory Visit: Payer: BC Managed Care – PPO

## 2021-08-27 DIAGNOSIS — Z3490 Encounter for supervision of normal pregnancy, unspecified, unspecified trimester: Secondary | ICD-10-CM | POA: Diagnosis not present

## 2021-08-27 DIAGNOSIS — Z3403 Encounter for supervision of normal first pregnancy, third trimester: Secondary | ICD-10-CM | POA: Diagnosis not present

## 2021-09-03 DIAGNOSIS — O24419 Gestational diabetes mellitus in pregnancy, unspecified control: Secondary | ICD-10-CM | POA: Diagnosis not present

## 2021-09-03 DIAGNOSIS — O365930 Maternal care for other known or suspected poor fetal growth, third trimester, not applicable or unspecified: Secondary | ICD-10-CM | POA: Diagnosis not present

## 2021-09-03 DIAGNOSIS — O99213 Obesity complicating pregnancy, third trimester: Secondary | ICD-10-CM | POA: Diagnosis not present

## 2021-09-09 DIAGNOSIS — O24419 Gestational diabetes mellitus in pregnancy, unspecified control: Secondary | ICD-10-CM | POA: Diagnosis not present

## 2021-09-10 ENCOUNTER — Telehealth (HOSPITAL_COMMUNITY): Payer: Self-pay | Admitting: *Deleted

## 2021-09-10 LAB — OB RESULTS CONSOLE GBS: GBS: POSITIVE

## 2021-09-10 NOTE — Telephone Encounter (Signed)
Preadmission screen  

## 2021-09-11 ENCOUNTER — Telehealth (HOSPITAL_COMMUNITY): Payer: Self-pay | Admitting: *Deleted

## 2021-09-11 ENCOUNTER — Encounter (HOSPITAL_COMMUNITY): Payer: Self-pay | Admitting: *Deleted

## 2021-09-11 DIAGNOSIS — O24419 Gestational diabetes mellitus in pregnancy, unspecified control: Secondary | ICD-10-CM | POA: Diagnosis not present

## 2021-09-11 DIAGNOSIS — O365930 Maternal care for other known or suspected poor fetal growth, third trimester, not applicable or unspecified: Secondary | ICD-10-CM | POA: Diagnosis not present

## 2021-09-11 DIAGNOSIS — O99213 Obesity complicating pregnancy, third trimester: Secondary | ICD-10-CM | POA: Diagnosis not present

## 2021-09-11 NOTE — Telephone Encounter (Signed)
Preadmission screen  

## 2021-09-16 NOTE — H&P (Signed)
HPI: 21 y.o. G1P0 @ [redacted]w[redacted]d estimated gestational age (as dated by LMP c/w 7 week ultrasound) presents for induction of labor for Class A1DM. ? ?Leakage of fluid:  No ?Vaginal bleeding:  No ?Contractions:  No ?Fetal movement:  Yes ? ?Prenatal care has been provided by Dr. Katrinka Blazing. Con[facility] Burkitt [facility]) ? ?ROS:  Denies fevers, chills, chest pain, visual changes, SOB, RUQ/epigastric pain, N/V, dysuria, hematuria, or sudden onset/worsening bilateral LE or facial edema. ? ?Pregnancy complicated by: ?Concern for fetal growth restriction (normal growth [place] with MFM) ?Class A1DM ?Rh negative ?Obesity (BMI 44) ?Increased risk for SMA carrier (FOB negative) ?Asthma (mild, persistent) - on Montelukast 10mg  daily, Loratadine 10mg  daily, Albuterol PRN ? ?Prenatal Transfer Tool  ?Maternal Diabetes: Yes:  Diabetes Type:  Diet controlled ?Genetic Screening: Normal ?Maternal Ultrasounds/Referrals: Normal ?Fetal Ultrasounds or other Referrals:  Referred to Materal Fetal Medicine  ?Maternal Substance Abuse:  No ?Significant Maternal Medications:  None ?Significant Maternal Lab Results: Group B Strep positive ? ? ?Prenatal Labs ?Blood type:  O Negative ?Antibody screen:  Negative ?CBC:  H/H 11/33.7 ?Rubella: Immune ?RPR:  Non-reactive ?Hep B:  Negative ?Hep C:  Negative ?HIV:  Negative ?GC/CT:  Negative ?Glucola:  164.9 (abnormal)  3h GTT abnormal ? ?Immunizations: ?Tdap: Given prenatally ?Flu: Declined ? ?OBHx:  ?OB History   ? ? Gravida  ?1  ? Para  ?   ? Term  ?   ? Preterm  ?   ? AB  ?   ? Living  ?   ?  ? ? SAB  ?   ? IAB  ?   ? Ectopic  ?   ? Multiple  ?   ? Live Births  ?   ?   ?  ?  ? ?PMHx:  See above ?Meds:  PNV, ASA 81mg  daily ?Allergy:  No Known Allergies ?SurgHx: None ?SocHx:   Denies Tobacco, ETOH, illicit drugs ? ?O: LMP 12/22/2020  ?Gen. AAOx3, NAD ?CV.  RRR  ?Resp. CTAB, no wheezes/rales/rhonchi ?Abd. Gravid, soft, non-tender throughout, no rebound/guarding ?Extr.  No bilateral LE edema, no calf tenderness  bilaterally ?SVE: 2/50/-3, sutures palpated  ? ?Last :  09/17/2021 [redacted]w[redacted]d EFW 2994g, 6 lbs 10 oz (22%), AAFV, Cephalic, posterior placenta, BPP 8/8 ? ? ?Labs: see orders ? ?A/P:  21 y.o. G1P0 @ [redacted]w[redacted]d who presents for induction of labor for Class A1DM. ? ?- Admit to L&D ?- Admit labs (CBC, T&S, COVID screen) ?- CEFM/Toco ?- Diet:  Clear liquids ?- IVF:  LR at 125cc/hour ?- VTE Prophylaxis:  SCDs ?- GBS Status:  Positive - PCN ordered ?- Presentation:  Confirm prior to IOL ?- Pain control:  Per patient request ?- Induction method:  Cytotec for cervical ripening ?- Anticipate SVD ? ?[redacted]w[redacted]d, DO ?[phone] (office) ? ? ? ? ? ?

## 2021-09-17 DIAGNOSIS — J45909 Unspecified asthma, uncomplicated: Secondary | ICD-10-CM

## 2021-09-17 DIAGNOSIS — O365930 Maternal care for other known or suspected poor fetal growth, third trimester, not applicable or unspecified: Secondary | ICD-10-CM | POA: Diagnosis not present

## 2021-09-17 DIAGNOSIS — O24419 Gestational diabetes mellitus in pregnancy, unspecified control: Secondary | ICD-10-CM | POA: Diagnosis not present

## 2021-09-17 DIAGNOSIS — O99213 Obesity complicating pregnancy, third trimester: Secondary | ICD-10-CM | POA: Diagnosis not present

## 2021-09-17 DIAGNOSIS — Z6841 Body Mass Index (BMI) 40.0 and over, adult: Secondary | ICD-10-CM

## 2021-09-20 ENCOUNTER — Other Ambulatory Visit: Payer: Self-pay

## 2021-09-21 ENCOUNTER — Encounter (HOSPITAL_COMMUNITY): Payer: Self-pay | Admitting: Obstetrics and Gynecology

## 2021-09-21 ENCOUNTER — Inpatient Hospital Stay (HOSPITAL_COMMUNITY)
Admission: AD | Admit: 2021-09-21 | Discharge: 2021-09-24 | DRG: 807 | Disposition: A | Discharge status: Home / Self Care | Payer: BC Managed Care – PPO | Attending: Obstetrics and Gynecology | Admitting: Obstetrics and Gynecology

## 2021-09-21 ENCOUNTER — Inpatient Hospital Stay (HOSPITAL_COMMUNITY): Payer: BC Managed Care – PPO

## 2021-09-21 ENCOUNTER — Inpatient Hospital Stay (HOSPITAL_COMMUNITY): Payer: BC Managed Care – PPO | Admitting: Anesthesiology

## 2021-09-21 DIAGNOSIS — J45909 Unspecified asthma, uncomplicated: Secondary | ICD-10-CM

## 2021-09-21 DIAGNOSIS — Z3A00 Weeks of gestation of pregnancy not specified: Secondary | ICD-10-CM | POA: Diagnosis not present

## 2021-09-21 DIAGNOSIS — O24410 Gestational diabetes mellitus in pregnancy, diet controlled: Secondary | ICD-10-CM | POA: Diagnosis present

## 2021-09-21 DIAGNOSIS — O26893 Other specified pregnancy related conditions, third trimester: Secondary | ICD-10-CM | POA: Diagnosis not present

## 2021-09-21 DIAGNOSIS — Z6791 Unspecified blood type, Rh negative: Secondary | ICD-10-CM

## 2021-09-21 DIAGNOSIS — Z3A39 39 weeks gestation of pregnancy: Secondary | ICD-10-CM | POA: Diagnosis not present

## 2021-09-21 DIAGNOSIS — J4530 Mild persistent asthma, uncomplicated: Secondary | ICD-10-CM | POA: Diagnosis present

## 2021-09-21 DIAGNOSIS — O24420 Gestational diabetes mellitus in childbirth, diet controlled: Secondary | ICD-10-CM | POA: Diagnosis not present

## 2021-09-21 DIAGNOSIS — O2492 Unspecified diabetes mellitus in childbirth: Secondary | ICD-10-CM | POA: Diagnosis not present

## 2021-09-21 DIAGNOSIS — O99214 Obesity complicating childbirth: Secondary | ICD-10-CM | POA: Diagnosis not present

## 2021-09-21 DIAGNOSIS — O43813 Placental infarction, third trimester: Secondary | ICD-10-CM | POA: Diagnosis not present

## 2021-09-21 DIAGNOSIS — O134 Gestational [pregnancy-induced] hypertension without significant proteinuria, complicating childbirth: Secondary | ICD-10-CM | POA: Diagnosis present

## 2021-09-21 DIAGNOSIS — Z6841 Body Mass Index (BMI) 40.0 and over, adult: Secondary | ICD-10-CM

## 2021-09-21 DIAGNOSIS — O99824 Streptococcus B carrier state complicating childbirth: Secondary | ICD-10-CM | POA: Diagnosis not present

## 2021-09-21 DIAGNOSIS — O411230 Chorioamnionitis, third trimester, not applicable or unspecified: Secondary | ICD-10-CM | POA: Diagnosis not present

## 2021-09-21 DIAGNOSIS — O24419 Gestational diabetes mellitus in pregnancy, unspecified control: Principal | ICD-10-CM

## 2021-09-21 DIAGNOSIS — O9952 Diseases of the respiratory system complicating childbirth: Secondary | ICD-10-CM | POA: Diagnosis present

## 2021-09-21 LAB — LACTATE DEHYDROGENASE: LDH: 143 U/L (ref 98–192)

## 2021-09-21 LAB — COMPREHENSIVE METABOLIC PANEL
ALT: 13 U/L (ref 0–44)
AST: 17 U/L (ref 15–41)
Albumin: 2.6 g/dL — ABNORMAL LOW (ref 3.5–5.0)
Alkaline Phosphatase: 106 U/L (ref 38–126)
Anion gap: 10 (ref 5–15)
BUN: <5 mg/dL — ABNORMAL LOW (ref 6–20)
CO2: 19 mmol/L — ABNORMAL LOW (ref 22–32)
Calcium: 8.7 mg/dL — ABNORMAL LOW (ref 8.9–10.3)
Chloride: 104 mmol/L (ref 98–111)
Creatinine, Ser: 0.65 mg/dL (ref 0.44–1.00)
GFR, Estimated: >60 mL/min (ref >60)
Glucose, Bld: 69 mg/dL — ABNORMAL LOW (ref 70–99)
Potassium: 3.8 mmol/L (ref 3.5–5.1)
Sodium: 133 mmol/L — ABNORMAL LOW (ref 135–145)
Total Bilirubin: 0.8 mg/dL (ref 0.3–1.2)
Total Protein: 6.2 g/dL — ABNORMAL LOW (ref 6.5–8.1)

## 2021-09-21 LAB — CBC WITH DIFFERENTIAL/PLATELET
Abs Immature Granulocytes: 0.12 10*3/uL — ABNORMAL HIGH (ref 0.00–0.07)
Basophils Absolute: 0 10*3/uL (ref 0.0–0.1)
Basophils Relative: 0 %
Eosinophils Absolute: 0 10*3/uL (ref 0.0–0.5)
Eosinophils Relative: 0 %
HCT: 35.2 % — ABNORMAL LOW (ref 36.0–46.0)
Hemoglobin: 11.3 g/dL — ABNORMAL LOW (ref 12.0–15.0)
Immature Granulocytes: 1 %
Lymphocytes Relative: 8 %
Lymphs Abs: 1.3 10*3/uL (ref 0.7–4.0)
MCH: 24.7 pg — ABNORMAL LOW (ref 26.0–34.0)
MCHC: 32.1 g/dL (ref 30.0–36.0)
MCV: 77 fL — ABNORMAL LOW (ref 80.0–100.0)
Monocytes Absolute: 1.3 10*3/uL — ABNORMAL HIGH (ref 0.1–1.0)
Monocytes Relative: 8 %
Neutro Abs: 13.1 10*3/uL — ABNORMAL HIGH (ref 1.7–7.7)
Neutrophils Relative %: 83 %
Platelets: 367 10*3/uL (ref 150–400)
RBC: 4.57 MIL/uL (ref 3.87–5.11)
RDW: 14.7 % (ref 11.5–15.5)
WBC: 15.8 10*3/uL — ABNORMAL HIGH (ref 4.0–10.5)
nRBC: 0 % (ref 0.0–0.2)

## 2021-09-21 LAB — CBC
HCT: 33.7 % — ABNORMAL LOW (ref 36.0–46.0)
Hemoglobin: 10.8 g/dL — ABNORMAL LOW (ref 12.0–15.0)
MCH: 24.8 pg — ABNORMAL LOW (ref 26.0–34.0)
MCHC: 32 g/dL (ref 30.0–36.0)
MCV: 77.3 fL — ABNORMAL LOW (ref 80.0–100.0)
Platelets: 370 10*3/uL (ref 150–400)
RBC: 4.36 MIL/uL (ref 3.87–5.11)
RDW: 14.7 % (ref 11.5–15.5)
WBC: 12.6 10*3/uL — ABNORMAL HIGH (ref 4.0–10.5)
nRBC: 0 % (ref 0.0–0.2)

## 2021-09-21 LAB — GLUCOSE, CAPILLARY
Glucose-Capillary: 73 mg/dL (ref 70–99)
Glucose-Capillary: 83 mg/dL (ref 70–99)
Glucose-Capillary: 77 mg/dL (ref 70–99)
Glucose-Capillary: 68 mg/dL — ABNORMAL LOW (ref 70–99)
Glucose-Capillary: 65 mg/dL — ABNORMAL LOW (ref 70–99)

## 2021-09-21 LAB — TYPE AND SCREEN
ABO/RH(D): O NEG
Antibody Screen: NEGATIVE

## 2021-09-21 LAB — RPR: RPR Ser Ql: NONREACTIVE

## 2021-09-21 LAB — PROTEIN / CREATININE RATIO, URINE
Creatinine, Urine: 97.19 mg/dL
Protein Creatinine Ratio: 0.11 mg/mg{Cre} (ref 0.00–0.15)
Total Protein, Urine: 11 mg/dL

## 2021-09-21 MED ORDER — INSULIN ASPART 100 UNIT/ML IJ SOLN: 0.0000 [IU] | INTRAMUSCULAR | Status: DC

## 2021-09-21 MED ORDER — OXYCODONE-ACETAMINOPHEN 5-325 MG PO TABS: 2.0000 | ORAL_TABLET | ORAL | Status: DC | PRN

## 2021-09-21 MED ORDER — ACETAMINOPHEN 325 MG PO TABS: 650.0000 mg | ORAL_TABLET | ORAL | Status: DC | PRN

## 2021-09-21 MED ORDER — EPHEDRINE 5 MG/ML INJ: 10.0000 mg | INTRAVENOUS | Status: DC | PRN

## 2021-09-21 MED ORDER — PHENYLEPHRINE 40 MCG/ML (10ML) SYRINGE FOR IV PUSH (FOR BLOOD PRESSURE SUPPORT): 80.0000 ug | PREFILLED_SYRINGE | INTRAVENOUS | Status: DC | PRN

## 2021-09-21 MED ORDER — PENICILLIN G POT IN DEXTROSE 60000 UNIT/ML IV SOLN
3.0000 10*6.[IU] | INTRAVENOUS | Status: DC
  Administered 2021-09-21 (×5): 3 10*6.[IU] via INTRAVENOUS
  Filled 2021-09-21 (×5): qty 50

## 2021-09-21 MED ORDER — HYDROXYZINE HCL 50 MG PO TABS: 50.0000 mg | ORAL_TABLET | Freq: Four times a day (QID) | ORAL | Status: DC | PRN

## 2021-09-21 MED ORDER — PHENYLEPHRINE 40 MCG/ML (10ML) SYRINGE FOR IV PUSH (FOR BLOOD PRESSURE SUPPORT)
80.0000 ug | PREFILLED_SYRINGE | INTRAVENOUS | Status: DC | PRN
  Administered 2021-09-21: 80 ug via INTRAVENOUS
  Filled 2021-09-21: qty 10

## 2021-09-21 MED ORDER — OXYTOCIN-SODIUM CHLORIDE 30-0.9 UT/500ML-% IV SOLN: 1.0000 m[IU]/min | INTRAVENOUS | Status: DC

## 2021-09-21 MED ORDER — LIDOCAINE HCL (PF) 1 % IJ SOLN
INTRAMUSCULAR | Status: DC | PRN
  Administered 2021-09-21: 11 mL via EPIDURAL

## 2021-09-21 MED ORDER — LACTATED RINGERS IV SOLN: INTRAVENOUS | Status: DC

## 2021-09-21 MED ORDER — OXYCODONE-ACETAMINOPHEN 5-325 MG PO TABS: 1.0000 | ORAL_TABLET | ORAL | Status: DC | PRN

## 2021-09-21 MED ORDER — SODIUM CHLORIDE 0.9 % IV SOLN
5.0000 10*6.[IU] | Freq: Once | INTRAVENOUS | Status: AC
  Administered 2021-09-21: 5 10*6.[IU] via INTRAVENOUS
  Filled 2021-09-21: qty 5

## 2021-09-21 MED ORDER — OXYTOCIN-SODIUM CHLORIDE 30-0.9 UT/500ML-% IV SOLN: 2.5000 [IU]/h | INTRAVENOUS | Status: DC

## 2021-09-21 MED ORDER — LIDOCAINE HCL (PF) 1 % IJ SOLN: 30.0000 mL | INTRAMUSCULAR | Status: DC | PRN

## 2021-09-21 MED ORDER — ONDANSETRON HCL 4 MG/2ML IJ SOLN
4.0000 mg | Freq: Four times a day (QID) | INTRAMUSCULAR | Status: DC | PRN
  Administered 2021-09-21: 4 mg via INTRAVENOUS
  Filled 2021-09-21: qty 2

## 2021-09-21 MED ORDER — MISOPROSTOL 25 MCG QUARTER TABLET
25.0000 ug | ORAL_TABLET | ORAL | Status: DC | PRN
  Administered 2021-09-21 (×2): 25 ug via VAGINAL
  Filled 2021-09-21 (×2): qty 1

## 2021-09-21 MED ORDER — SOD CITRATE-CITRIC ACID 500-334 MG/5ML PO SOLN: 30.0000 mL | ORAL | Status: DC | PRN

## 2021-09-21 MED ORDER — OXYTOCIN BOLUS FROM INFUSION
333.0000 mL | Freq: Once | INTRAVENOUS | Status: AC
  Administered 2021-09-22: 333 mL via INTRAVENOUS

## 2021-09-21 MED ORDER — BUPIVACAINE HCL (PF) 0.25 % IJ SOLN
INTRAMUSCULAR | Status: DC | PRN
  Administered 2021-09-21 (×2): 10 mL via EPIDURAL

## 2021-09-21 MED ORDER — DIPHENHYDRAMINE HCL 50 MG/ML IJ SOLN: 12.5000 mg | INTRAMUSCULAR | Status: DC | PRN

## 2021-09-21 MED ORDER — TERBUTALINE SULFATE 1 MG/ML IJ SOLN: 0.2500 mg | Freq: Once | INTRAMUSCULAR | Status: DC | PRN

## 2021-09-21 MED ORDER — OXYTOCIN-SODIUM CHLORIDE 30-0.9 UT/500ML-% IV SOLN
1.0000 m[IU]/min | INTRAVENOUS | Status: DC
  Administered 2021-09-21: 2 m[IU]/min via INTRAVENOUS
  Filled 2021-09-21: qty 500

## 2021-09-21 MED ORDER — LACTATED RINGERS IV SOLN: 500.0000 mL | Freq: Once | INTRAVENOUS | Status: DC

## 2021-09-21 MED ORDER — LACTATED RINGERS IV SOLN
500.0000 mL | INTRAVENOUS | Status: DC | PRN
  Administered 2021-09-21 (×3): 500 mL via INTRAVENOUS

## 2021-09-21 MED ORDER — FENTANYL-BUPIVACAINE-NACL 0.5-0.125-0.9 MG/250ML-% EP SOLN
12.0000 mL/h | EPIDURAL | Status: DC | PRN
  Administered 2021-09-21: 12 mL/h via EPIDURAL
  Filled 2021-09-21: qty 250

## 2021-09-21 MED ORDER — FENTANYL CITRATE (PF) 100 MCG/2ML IJ SOLN: 50.0000 ug | INTRAMUSCULAR | Status: DC | PRN

## 2021-09-21 NOTE — Progress Notes (Signed)
OB Progress Note ? ?S: Patient on right side with peanut ball in place. Having some contractions on lower ride abdomen.  ? ?O: BP (!) 146/61   Pulse 68   Temp 99.2 ?F (37.3 ?C) (Oral)   Resp 16   Ht 5\' 8"  (1.727 m)   Wt (!) 138.9 kg   LMP 12/22/2020   SpO2 99%   BMI 46.56 kg/m?  ? ?FHT: 155bpm, moderate variablity, - accels, early decels ?Toco: q1-2 minutes ?SVE: 6/90/-2 ? ?A/P: 21 y.o. G1P0 @ [redacted]w[redacted]d admitted for induction of labor for A1DM. ? ?FWB: Cat. I ?Labor course: S/p cytotec vaginally x 2 doses, s/p AROM at approximately 2:30pm, Pitocin turned back down to 82mU/min due to tachysystole ?Pain: Epidural in place - anesthesia to re-evaluate ?GBS: Positive - s/p 5 doses of PCN ?Anticipate SVD ? ?After leaving the room, tachysystole noted on toco. Discussed with primary RN to give pitocin break and IVF bolus. Restart pitocin at 30mU/min at 2030. Patient placed in throne position to aid with fetal descent. ? ?3m, DO ? ? ? ? ?

## 2021-09-21 NOTE — Progress Notes (Signed)
OB Progress Note ? ?S: Patient resting. Reports contractions are 4/10. Best friend at bedside. Consents to AROM and IUPC placement. Does not desire epidural yet. ? ?O: BP (!) 150/83   Pulse 77   Temp 98 ?F (36.7 ?C) (Oral)   Resp 16   Ht 5\' 8"  (1.727 m)   Wt (!) 138.9 kg   LMP 12/22/2020   SpO2 99%   BMI 46.56 kg/m?  ? ?FHT: 155bpm, moderate to marked variablity, + accels, - decels ?Toco: q1-2 minutes ?SVE: 4/80/-3 ?AROM: Clear, non-odorous ? ?A/P: 21 y.o. G1P0 @ [redacted]w[redacted]d admitted for induction of labor for A1DM. ? ?FWB: Cat. II (due to occasional period of marked variability) ?Labor course: S/p cytotec vaginally x 2 doses, Pitocin at 71mU/min, s/p AROM now ?Pain: Per patient request ?GBS: Positive - s/p 2 doses of PCN ?Anticipate SVD ? ?4m, DO ? ? ? ? ?

## 2021-09-21 NOTE — Anesthesia Preprocedure Evaluation (Signed)
Anesthesia Evaluation  ?Patient identified by MRN, date of birth, ID band ?Patient awake ? ? ? ?Reviewed: ?Allergy & Precautions, NPO status , Patient's Chart, lab work & pertinent test results ? ?Airway ?Mallampati: II ? ?TM Distance: >3 FB ?Neck ROM: Full ? ? ? Dental ?no notable dental hx. ? ?  ?Pulmonary ?asthma , former smoker,  ?  ?Pulmonary exam normal ?breath sounds clear to auscultation ? ? ? ? ? ? Cardiovascular ?negative cardio ROS ?Normal cardiovascular exam ?Rhythm:Regular Rate:Normal ? ? ?  ?Neuro/Psych ?negative neurological ROS ? negative psych ROS  ? GI/Hepatic ?negative GI ROS, Neg liver ROS,   ?Endo/Other  ?negative endocrine ROSdiabetes ? Renal/GU ?negative Renal ROS  ?negative genitourinary ?  ?Musculoskeletal ?negative musculoskeletal ROS ?(+)  ? Abdominal ?(+) + obese,   ?Peds ?negative pediatric ROS ?(+)  Hematology ?negative hematology ROS ?(+)   ?Anesthesia Other Findings ? ? Reproductive/Obstetrics ?(+) Pregnancy ? ?  ? ? ? ? ? ? ? ? ? ? ? ? ? ?  ?  ? ? ? ? ? ? ? ? ?Anesthesia Physical ?Anesthesia Plan ? ?ASA: 3 ? ?Anesthesia Plan: Epidural  ? ?Post-op Pain Management:   ? ?Induction:  ? ?PONV Risk Score and Plan:  ? ?Airway Management Planned:  ? ?Additional Equipment:  ? ?Intra-op Plan:  ? ?Post-operative Plan:  ? ?Informed Consent:  ? ?Plan Discussed with:  ? ?Anesthesia Plan Comments:   ? ? ? ? ? ? ?Anesthesia Quick Evaluation ? ?

## 2021-09-21 NOTE — Progress Notes (Signed)
OB Progress Note ? ?S: Patient resting comfortably. Reports feeling mild contractions. Vomited breakfast. Consents to FB if indicated. ? ?O: BP (!) 142/72   Pulse 75   Temp 98 ?F (36.7 ?C) (Oral)   Resp 16   Ht 5\' 8"  (1.727 m)   Wt (!) 138.9 kg   LMP 12/22/2020   SpO2 99%   BMI 46.56 kg/m?  ? ?FHT: 150bpm, moderate to marked variablity, + accels, - decels ?Toco: q1-3 minutes, irregular ?SVE: 3-4/80/-3 ? ?A/P: 21 y.o. G1P0 @ [redacted]w[redacted]d admitted for induction of labor for A1DM. ? ?FWB: Cat. II (due to marked variability), previously Category I with moderate variability ?Labor course: S/p cytotec vaginally x 2 doses, will start Pitocin 2x2 ?Pain: Per patient request ?GBS: Positive - s/p 2 doses of PCN ?Anticipate SVD ? ?[redacted]w[redacted]d, DO ? ? ? ? ?

## 2021-09-21 NOTE — Anesthesia Procedure Notes (Signed)
Epidural ?Patient location during procedure: OB ?Start time: 09/21/2021 3:25 PM ?End time: 09/21/2021 3:41 PM ? ?Staffing ?Anesthesiologist: Lowella Curb, MD ?Performed: anesthesiologist  ? ?Preanesthetic Checklist ?Completed: patient identified, IV checked, site marked, risks and benefits discussed, surgical consent, monitors and equipment checked, pre-op evaluation and timeout performed ? ?Epidural ?Patient position: sitting ?Prep: ChloraPrep ?Patient monitoring: heart rate, cardiac monitor, continuous pulse ox and blood pressure ?Approach: midline ?Location: L2-L3 ?Injection technique: LOR saline ? ?Needle:  ?Needle type: Tuohy  ?Needle gauge: 17 G ?Needle length: 9 cm ?Needle insertion depth: 9 cm ?Catheter type: closed end flexible ?Catheter size: 20 Guage ?Catheter at skin depth: 14 cm ?Test dose: negative ? ?Assessment ?Events: blood not aspirated, injection not painful, no injection resistance, no paresthesia and negative IV test ? ?Additional Notes ?Epidural placed by SRNA under direct supervisionReason for block:procedure for pain ? ? ? ?

## 2021-09-21 NOTE — Progress Notes (Signed)
Reviewed FHT: ? ?Baseline 150bpm, moderate variability, - accel, - decelerations. Contractions every minute or less - tachysystole. Discussed with primary RN to decrease pitocin to 69mU/min (may require further decrease). Patient just received epidural. ? ?Steva Ready, DO ? ? ?

## 2021-09-22 ENCOUNTER — Encounter (HOSPITAL_COMMUNITY): Payer: Self-pay | Admitting: Obstetrics and Gynecology

## 2021-09-22 LAB — CBC
HCT: 32 % — ABNORMAL LOW (ref 36.0–46.0)
Hemoglobin: 10.4 g/dL — ABNORMAL LOW (ref 12.0–15.0)
MCH: 24.7 pg — ABNORMAL LOW (ref 26.0–34.0)
MCHC: 32.5 g/dL (ref 30.0–36.0)
MCV: 76 fL — ABNORMAL LOW (ref 80.0–100.0)
Platelets: 352 10*3/uL (ref 150–400)
RBC: 4.21 MIL/uL (ref 3.87–5.11)
RDW: 14.7 % (ref 11.5–15.5)
WBC: 21.5 10*3/uL — ABNORMAL HIGH (ref 4.0–10.5)
nRBC: 0 % (ref 0.0–0.2)

## 2021-09-22 LAB — GLUCOSE, CAPILLARY
Glucose-Capillary: 71 mg/dL (ref 70–99)
Glucose-Capillary: 94 mg/dL (ref 70–99)
Glucose-Capillary: 75 mg/dL (ref 70–99)

## 2021-09-22 MED ORDER — ONDANSETRON HCL 4 MG PO TABS: 4.0000 mg | ORAL_TABLET | ORAL | Status: DC | PRN

## 2021-09-22 MED ORDER — ZOLPIDEM TARTRATE 5 MG PO TABS: 5.0000 mg | ORAL_TABLET | Freq: Every evening | ORAL | Status: DC | PRN

## 2021-09-22 MED ORDER — DIBUCAINE (PERIANAL) 1 % EX OINT: 1.0000 "application " | TOPICAL_OINTMENT | CUTANEOUS | Status: DC | PRN

## 2021-09-22 MED ORDER — ONDANSETRON HCL 4 MG/2ML IJ SOLN: 4.0000 mg | INTRAMUSCULAR | Status: DC | PRN

## 2021-09-22 MED ORDER — DIPHENHYDRAMINE HCL 25 MG PO CAPS: 25.0000 mg | ORAL_CAPSULE | Freq: Four times a day (QID) | ORAL | Status: DC | PRN

## 2021-09-22 MED ORDER — LABETALOL HCL 5 MG/ML IV SOLN: 40.0000 mg | INTRAVENOUS | Status: DC | PRN

## 2021-09-22 MED ORDER — COCONUT OIL OIL
1.0000 "application " | TOPICAL_OIL | Status: DC | PRN
  Administered 2021-09-22: 1 via TOPICAL

## 2021-09-22 MED ORDER — BENZOCAINE-MENTHOL 20-0.5 % EX AERO
1.0000 | INHALATION_SPRAY | CUTANEOUS | Status: DC | PRN
Start: 2021-09-22 — End: 2021-09-24

## 2021-09-22 MED ORDER — LABETALOL HCL 5 MG/ML IV SOLN: 20.0000 mg | INTRAVENOUS | Status: DC | PRN

## 2021-09-22 MED ORDER — SENNOSIDES-DOCUSATE SODIUM 8.6-50 MG PO TABS
2.0000 | ORAL_TABLET | Freq: Every day | ORAL | Status: DC
  Administered 2021-09-23 – 2021-09-24 (×2): 2 via ORAL
  Filled 2021-09-22 (×2): qty 2

## 2021-09-22 MED ORDER — PRENATAL MULTIVITAMIN CH
1.0000 | ORAL_TABLET | Freq: Every day | ORAL | Status: DC
  Administered 2021-09-22 – 2021-09-24 (×3): 1 via ORAL
  Filled 2021-09-22 (×3): qty 1

## 2021-09-22 MED ORDER — HYDRALAZINE HCL 20 MG/ML IJ SOLN: 5.0000 mg | INTRAMUSCULAR | Status: DC | PRN

## 2021-09-22 MED ORDER — ACETAMINOPHEN 325 MG PO TABS: 650.0000 mg | ORAL_TABLET | ORAL | Status: DC | PRN

## 2021-09-22 MED ORDER — WITCH HAZEL-GLYCERIN EX PADS: 1.0000 "application " | MEDICATED_PAD | CUTANEOUS | Status: DC | PRN

## 2021-09-22 MED ORDER — HYDRALAZINE HCL 20 MG/ML IJ SOLN: 10.0000 mg | INTRAMUSCULAR | Status: DC | PRN

## 2021-09-22 MED ORDER — SIMETHICONE 80 MG PO CHEW: 80.0000 mg | CHEWABLE_TABLET | ORAL | Status: DC | PRN

## 2021-09-22 MED ORDER — IBUPROFEN 600 MG PO TABS
600.0000 mg | ORAL_TABLET | Freq: Four times a day (QID) | ORAL | Status: DC
  Administered 2021-09-22 – 2021-09-24 (×10): 600 mg via ORAL
  Filled 2021-09-22 (×10): qty 1

## 2021-09-22 NOTE — Lactation Note (Signed)
This note was copied from a baby's chart. ?Lactation Consultation Note ?Mom had company in room. Mom is giving formula/BF. Mom stated he isn't latching to good. ?LC asked mom to call for [facility] for next feeding for latch assistance. Mom stated OK. ? ?Patient Name: Jackie Watkins ?Today's Date: 09/22/2021 ?  ?Age:23 hours ? ?Maternal Data ?  ? ?Feeding ?  ? ?LATCH Score ?  ? ?  ? ?  ? ?  ? ?  ? ?  ? ? ?Lactation Tools Discussed/Used ?  ? ?Interventions ?  ? ?Discharge ?  ? ?Consult Status ?  ? ? ? ?Charyl Dancer ?09/22/2021, 8:05 PM ? ? ? ?

## 2021-09-22 NOTE — Anesthesia Postprocedure Evaluation (Signed)
Anesthesia Post Note ? ?Patient: Christiann Hagerty ? ?Procedure(s) Performed: AN AD HOC LABOR EPIDURAL ? ?  ? ?Patient location during evaluation: Mother Baby ?Anesthesia Type: Epidural ?Level of consciousness: awake and alert ?Pain management: pain level controlled ?Vital Signs Assessment: post-procedure vital signs reviewed and stable ?Respiratory status: spontaneous breathing, nonlabored ventilation and respiratory function stable ?Cardiovascular status: stable ?Postop Assessment: no headache, no backache, epidural receding, no apparent nausea or vomiting, patient able to bend at knees, adequate PO intake and able to ambulate ?Anesthetic complications: no ? ? ?No notable events documented. ? ?Last Vitals:  ?Vitals:  ? 09/22/21 0810 09/22/21 1241  ?BP: 130/82 132/76  ?Pulse: 72 71  ?Resp: 17 18  ?Temp: 36.8 ?C 36.6 ?C  ?SpO2: 100% 100%  ?  ?Last Pain:  ?Vitals:  ? 09/22/21 1241  ?TempSrc: Oral  ?PainSc:   ? ?Pain Goal:   ? ?  ?  ?  ?  ?  ?  ?  ? ?Joud Pettinato Hristova ? ? ? ? ?

## 2021-09-22 NOTE — Progress Notes (Signed)
Postpartum Note Day #0 ? ?S:  Patient doing well.  Pain controlled.  Tolerating regular diet.   Ambulating and voiding without difficulty. Working on breastfeeding. Has had to supplement some with formula. She and her significant other discussed circumcision and desires to proceed. Denies fevers, chills, chest pain, SOB, N/V, or worsening bilateral LE edema. ? ?Lochia: Minimal ?Infant feeding:  Breast/formula ?Circumcision:  Desires prior to discharge ?Contraception:  None ? ?O: Temp:  [98.2 ?F (36.8 ?C)-99.5 ?F (37.5 ?C)] 98.2 ?F (36.8 ?C) (04/02 0810) ?Pulse Rate:  [60-104] 72 (04/02 0810) ?Resp:  [16-17] 17 (04/02 0810) ?BP: (84-154)/(45-107) 130/82 (04/02 0810) ?SpO2:  [97 %-100 %] 100 % (04/02 0810) ?Gen: NAD, pleasant and cooperative ?CV: RRR ?Resp: CTAB, no wheezes/rales/rhonchi ?Abdomen: soft, non-distended, non-tender throughout ?Uterus: firm, non-tender, below umbilicus ?Ext: No bilateral LE edema, no bilateral calf tenderness ? ?Labs:  ?Recent Labs  ?  09/21/21 ?2044 09/22/21 ?3557  ?HGB 11.3* 10.4*  ? ? ?A/P: Patient is a 21 y.o. G1P1001 PPD#0 s/p SVD. ? ?S/p SVD ?- Pain well controlled  ?- GU: UOP is adequate ?- GI: Tolerating regular diet ?- Activity: encouraged sitting up to chair and ambulation as tolerated ?- DVT Prophylaxis: Frequent ambulation, SCDs in bed ?- Labs: stable as above ? ?Gestational HTN ?- Based on mild range Bps during labor ?- Preeclampsia labs negative, PCR 0.11 ?- Medications: None ? ?Class A1DM ?- Fasting BS postpartum 75 ?- Will need screening for overt diabetes postpartum ? ?Rh negative ?- Rhogam eval ordered ? ?Circumcision consent: ?Routine circumcisions performed on newborns have been identified as voluntary, elective procedures by [employer] such as the [employer] [facility].  It is considered an elective procedure with no definitive medical indication and carries risks. ? ?Risks include but are not limited to bleeding, infection, damage to penis with  possible need for further surgery, poor cosmesis, and local anesthetic risks. ? ?Circumcision will only be performed if patient is deemed to have normal anatomy by his Pediatrician, meets adequate criteria for a newborn of similar gestational age after birth and is without infection or other medical issue contraindicating an elective procedure. ? ? ?Patient understands and agrees with above consent ?Patient discussed with mother of infant ? ?Disposition:  D/C home PPD#2  ? ?Steva Ready, DO ?[phone] (office) ? ? ? ? ? ? ?

## 2021-09-22 NOTE — Lactation Note (Signed)
This note was copied from a baby's chart. ?Lactation Consultation Note ? ?Patient Name: Jackie Watkins ?Today's Date: 09/22/2021 ?Reason for consult: L&D Initial assessment;Maternal endocrine disorder ?Age:22 hours ?LC entered the room mom was doing skin to skin with infant. ?Mom attempted to latch infant at the breast, infant would not latch at this time, infant would only lick, tasted and sucks on his tongue. ?LC discussed hand expression with breast model and mom self expressed 2 mls of colostrum that was spoon feed to infant.  ?Mom knows to ask RN/LC on MBU for further latch assistance. ?Mom knows to breastfeed infant according to hunger cues, 8 to 12+ times within 24 hours, skin to skin. ?Mom will continue to work on latching infant at the breast and knows if infant doesn't latch to hand express and give him back EBM by spoon. ? ?Maternal Data ?Has patient been taught Hand Expression?: Yes ?Does the patient have breastfeeding experience prior to this delivery?: No ? ?Feeding ?Mother's Current Feeding Choice: Breast Milk ? ?LATCH Score ?Latch: Too sleepy or reluctant, no latch achieved, no sucking elicited. ? ?Audible Swallowing: None ? ?Type of Nipple: Flat ? ?Comfort (Breast/Nipple): Soft / non-tender ? ?Hold (Positioning): Assistance needed to correctly position infant at breast and maintain latch. ? ?LATCH Score: 4 ? ? ?Lactation Tools Discussed/Used ?  ? ?Interventions ?Interventions: Assisted with latch;Skin to skin;Hand express;Adjust position;Support pillows;Position options;Breast compression;Expressed milk;Education ? ?Discharge ?  ? ?Consult Status ?Consult Status: Follow-up ?Date: 09/22/21 ?Follow-up type: In-patient ? ? ? ?Danelle Earthly ?09/22/2021, 2:25 AM ? ? ? ?

## 2021-09-23 MED ORDER — RHO D IMMUNE GLOBULIN 1500 UNIT/2ML IJ SOSY
300.0000 ug | PREFILLED_SYRINGE | Freq: Once | INTRAMUSCULAR | Status: AC
  Administered 2021-09-23: 300 ug via INTRAVENOUS
  Filled 2021-09-23: qty 2

## 2021-09-23 NOTE — Progress Notes (Signed)
Post Partum Day 1 ?Subjective: ?no complaints, up ad lib, voiding, tolerating PO, and + flatus ? ?Objective: ?Blood pressure 114/69, pulse 62, temperature (!) 97.5 ?F (36.4 ?C), temperature source Oral, resp. rate 18, height 5\' 8"  (1.727 m), weight (!) 138.9 kg, last menstrual period 12/22/2020, SpO2 100 %, unknown if currently breastfeeding. ? ?Physical Exam:  ?General: alert, cooperative, and no distress ?Lochia: appropriate ?Uterine Fundus: firm ?Incision: NA ?DVT Evaluation: No evidence of DVT seen on physical exam. ? ?Recent Labs  ?  09/21/21 ?2044 09/22/21 ?0337  ?HGB 11.3* 10.4*  ?HCT 35.2* 32.0*  ? ? ?Assessment/Plan: ?Plan for discharge tomorrow, Breastfeeding, Lactation consult, and Circumcision prior to discharge ?Gestational hypertesion - BP normal no medications necessary at present.  ? ? ? ? LOS: 2 days  ? ?Jackie Watkins ?09/23/2021, 1:04 PM  ? ? ?

## 2021-09-23 NOTE — Progress Notes (Signed)
RN called provider due to two consistent increased blood pressures.  Dillard MD notified of pressures and informed that pt is asymptomatic. Call on call provider for blood pressure greater than 140s/90s, on call provider will reassess need for treatment.  ?

## 2021-09-23 NOTE — Lactation Note (Signed)
This note was copied from a baby's chart. ?Lactation Consultation Note ? ?Patient Name: Boy Tyteana Norfleet ?Today's Date: 09/23/2021 ?Reason for consult: Follow-up assessment;Mother's request;Difficult latch;Primapara;1st time breastfeeding;Term;Breastfeeding assistance;Maternal endocrine disorder ?Age:22 hours ?Mom had trouble with latching. We did some suck training to relax tongue and infant able to latch with signs of milk transfer and dimpling resolved.  ? ?Plan 1. To feed based on cues 8-12x 24hr period. Mom to offer breasts and look for signs of milk transfer.  ?2. Mom to supplement with EBM first followed by formula with slow flow nipple and pace bottle feeding. BF supplementation guide provided and reviewed. Mom aware to offer more if not able to get infant to latch.  ?3. Manual pump q 3hrs for 10 min each breast.  ?Mom to call medicaid to work on getting a Environmental manager pump for home.  ? ?Maternal Data ?Does the patient have breastfeeding experience prior to this delivery?: Yes ?How long did the patient breastfeed?: ample colostrum noted ? ?Feeding ?Mother's Current Feeding Choice: Breast Milk and Formula ? ?LATCH Score ?Latch: Repeated attempts needed to sustain latch, nipple held in mouth throughout feeding, stimulation needed to elicit sucking reflex. ? ?Audible Swallowing: Spontaneous and intermittent ? ?Type of Nipple: Everted at rest and after stimulation ? ?Comfort (Breast/Nipple): Soft / non-tender ? ?Hold (Positioning): Assistance needed to correctly position infant at breast and maintain latch. ? ?LATCH Score: 8 ? ? ?Lactation Tools Discussed/Used ?Tools: Pump;Flanges ?Flange Size: 24 ?Breast pump type: Manual ?Pump Education: Setup, frequency, and cleaning;Milk Storage ?Reason for Pumping: increase stimulation ?Pumping frequency: every 3 hrs for 10 min each breast ? ?Interventions ?Interventions: Breast feeding basics reviewed;Assisted with latch;Skin to skin;Hand express;Breast massage;Pre-pump  if needed;Breast compression;Adjust position;Support pillows;Position options;Expressed milk;Hand pump;Education;Pace feeding;LC Magazine features editor;Infant Driven Feeding Algorithm education ? ?Discharge ?Discharge Education: Engorgement and breast care;Warning signs for feeding baby;Outpatient recommendation ?Pump: Manual ?WIC Program: Yes ? ?Consult Status ?Consult Status: Complete ?Date: 09/23/21 ?Follow-up type: In-patient ? ? ? ?Lenny Bouchillon  Nicholson-Springer ?09/23/2021, 12:01 PM ? ? ? ?

## 2021-09-24 LAB — RH IG WORKUP (INCLUDES ABO/RH)
Fetal Screen: NEGATIVE
Gestational Age(Wks): 39
Unit division: 0

## 2021-09-24 LAB — SURGICAL PATHOLOGY

## 2021-09-24 MED ORDER — IBUPROFEN 600 MG PO TABS: 600.0000 mg | ORAL_TABLET | Freq: Four times a day (QID) | ORAL | 0 refills | Status: AC

## 2021-09-24 NOTE — Discharge Summary (Signed)
Postpartum Discharge Summary ? ?Date of Service: 09/24/21  ?   ?Patient Name: Jackie Watkins ?DOB: [birth year] ?MRN: [id number] ? ?Date of admission: 09/21/2021 ?Delivery date:09/22/2021  ?Delivering provider: Adel Neyer  ?Date of discharge: 09/24/2021 ? ?Admitting diagnosis: Gestational diabetes, diet controlled [O24.410] ?Intrauterine pregnancy: [redacted]w[redacted]d    ?Secondary diagnosis:  Principal Problem: ?  Gestational diabetes ?Active Problems: ?  BMI 40.0-44.9, adult ([place] ?  Asthma ?  Gestational diabetes, diet controlled ? ?Additional problems: Rh Negative, Increased risk for SMA carrier (FOB negative)    ?Discharge diagnosis: Term Pregnancy Delivered                                              ?Post partum procedures:rhogam ?Augmentation: AROM, Pitocin, and Cytotec ?Complications: None ? ?Hospital course: Induction of Labor With Vaginal Delivery   ?21y.o. yo G1P1001 at 349w1das admitted to the hospital 09/21/2021 for induction of labor.  Indication for induction: A1 DM.  Patient had an uncomplicated labor course as follows: ?Membrane Rupture Time/Date: 2:27 PM ,09/21/2021   ?Delivery Method:Vaginal, Spontaneous  ?Episiotomy: None  ?Lacerations:  Periurethral  ?Details of delivery can be found in separate delivery note.  Patient had a routine postpartum course. Patient is discharged home 09/24/21. ? ?Newborn Data: ?Birth date:09/22/2021  ?Birth time:1:12 AM  ?Gender:Female  ?Living status:Living  ?Apgars:8 ,9  ?Weight:2820 g  ? ?Magnesium Sulfate received: No ?BMZ received: No ?Rhophylac:Yes ?MMR:N/A ?T-DaP:Given prenatally ?Flu: No ?Transfusion:No ? ?Physical exam  ?Vitals:  ? 09/23/21 1530 09/23/21 1730 09/23/21 2254 09/24/21 061991?BP: (!) 149/85 (!) 148/96 126/65 (!) 113/57  ?Pulse: (!) 56 70 66 (!) 51  ?Resp: 16 18 18 18   ?Temp: 98.8 ?F (37.1 ?C)  98 ?F (36.7 ?C) 98.6 ?F (37 ?C)  ?TempSrc: Oral  Oral Oral  ?SpO2: 100%  100%   ?Weight:      ?Height:      ? ?General: alert, cooperative, and no distress ?Cardio:  RRR ?Lungs:   CTAB, no wheezes/rales/rhonchi ?Lochia: appropriate ?Uterine Fundus: firm ?Incision: N/A ?DVT Evaluation: No evidence of DVT seen on physical exam. ?No cords or calf tenderness. ?Labs: ?Lab Results  ?Component Value Date  ? WBC 21.5 (H) 09/22/2021  ? HGB 10.4 (L) 09/22/2021  ? HCT 32.0 (L) 09/22/2021  ? MCV 76.0 (L) 09/22/2021  ? PLT 352 09/22/2021  ? ? ?  Latest Ref Rng & Units 09/21/2021  ?  8:44 PM  ?CMP  ?Glucose 70 - 99 mg/dL 69    ?BUN 6 - 20 mg/dL <5    ?Creatinine 0.44 - 1.00 mg/dL 0.65    ?Sodium 135 - 145 mmol/L 133    ?Potassium 3.5 - 5.1 mmol/L 3.8    ?Chloride 98 - 111 mmol/L 104    ?CO2 22 - 32 mmol/L 19    ?Calcium 8.9 - 10.3 mg/dL 8.7    ?Total Protein 6.5 - 8.1 g/dL 6.2    ?Total Bilirubin 0.3 - 1.2 mg/dL 0.8    ?Alkaline Phos 38 - 126 U/L 106    ?AST 15 - 41 U/L 17    ?ALT 0 - 44 U/L 13    ? ?Edinburgh Score: ? ?  09/23/2021  ? 10:50 AM  ?Edinburgh Postnatal Depression Scale Screening Tool  ?I have been able to laugh and see the funny side of things. 0  ?I have looked  forward with enjoyment to things. 0  ?I have blamed myself unnecessarily when things went wrong. 1  ?I have been anxious or worried for no good reason. 2  ?I have felt scared or panicky for no good reason. 2  ?Things have been getting on top of me. 1  ?I have been so unhappy that I have had difficulty sleeping. 0  ?I have felt sad or miserable. 0  ?I have been so unhappy that I have been crying. 0  ?The thought of harming myself has occurred to me. 0  ?Edinburgh Postnatal Depression Scale Total 6  ? ? ? ? ?After visit meds:  ?Allergies as of 09/24/2021   ?No Known Allergies ?  ? ?  ?Medication List  ?  ? ?TAKE these medications   ? ?albuterol 108 (90 Base) MCG/ACT inhaler ?Commonly known as: VENTOLIN HFA ?Inhale 2 puffs into the lungs every 6 (six) hours as needed. Shortness of breath/wheezing ?  ?ibuprofen 600 MG tablet ?Commonly known as: ADVIL ?Take 1 tablet (600 mg total) by mouth every 6 (six) hours. ?  ?montelukast 10 MG  tablet ?Commonly known as: SINGULAIR ?Take 10 mg by mouth at bedtime. ?  ?prenatal multivitamin Tabs tablet ?Take 1 tablet by mouth daily at 12 noon. ?  ? ?  ? ? ? ?Discharge home in stable condition ?Infant Feeding: Bottle and Breast ?Infant Disposition:home with mother ?Discharge instruction: per After Visit Summary and Postpartum booklet. ?Activity: Advance as tolerated. Pelvic rest for 6 weeks.  ?Diet: routine diet ?Anticipated Birth Control:  None ?Postpartum Appointment:6 weeks ?Additional Postpartum F/U: BP check 1 week ?Future Appointments:No future appointments. ?Follow up Visit: ? Follow-up Information   ? ? Drema Dallas, DO Follow up in 1 week(s).   ?Specialty: Obstetrics and Gynecology ?Why: Our office will arrange a 1 week blood pressure check for you. Please keep your 6 week postpartum visit as previously scheduled. ?Contact information: ?[address] ?[address] ?[place] [address] ?[phone] ? ? ?  ?  ? ?  ?  ? ?  ? ? ? ?  ? ?09/24/2021 ?Drema Dallas, DO ? ? ?

## 2021-09-24 NOTE — Lactation Note (Signed)
This note was copied from a baby's chart. ?Lactation Consultation Note ? ?Patient Name: Jackie Watkins ?Today's Date: 09/24/2021 ?Reason for consult: Follow-up assessment;Mother's request;Difficult latch;Primapara;1st time breastfeeding;Term;Breastfeeding assistance ?Age:22 hours ? ?Dad bottle feeding infant on arrival. [facility] sent outpatient clinic for lactation a message to schedule a follow up appt to work on latching. ? ?Mom denies any pain with current flange size and latching. Mom states hard to get infant to sustain the latch.  ? ?Plan 1. To feed based on cues 8-12x 24hr period. Mom to offer breasts and look for signs of milk transfer.  ?2. Mom to supplement with ebm first followed by formula with pace bottle feeding and slow flow nipple. Mom aware if infant not latching to offer more volume. Volume guide provided.  ?3. Manual pump q 3hrs for 10 min each breast.  ?Mom working on getting an Mining engineer pump through her insurance.  ?All questions answered at the end of the visit.  ? ?Maternal Data ?Has patient been taught Hand Expression?: Yes ?Does the patient have breastfeeding experience prior to this delivery?: No ? ?Feeding ?Mother's Current Feeding Choice: Breast Milk and Formula ?Nipple Type: Slow - flow ? ?LATCH Score ?  ? ?  ? ?  ? ?  ? ?  ? ?  ? ? ?Lactation Tools Discussed/Used ?Tools: Pump;Flanges ?Breast pump type: Manual ?Pump Education: Setup, frequency, and cleaning;Milk Storage ?Reason for Pumping: increase stimulation ?Pumping frequency: every 3 hrs for 10 min each breast ? ?Interventions ?Interventions: Breast feeding basics reviewed;Skin to skin;Breast massage;Breast compression;Support pillows;Position options;Expressed milk;Hand pump;Education;Pace feeding;Theme park manager brochure ? ?Discharge ?Discharge Education: Engorgement and breast care;Warning signs for feeding baby;Outpatient recommendation;Outpatient Epic message sent ?Pump: Manual ?WIC Program:  Yes ? ?Consult Status ?Consult Status: Complete ?Date: 09/24/21 ?Follow-up type: In-patient ? ? ? ?Gini Caputo  Nicholson-Springer ?09/24/2021, 11:34 AM ? ? ? ?

## 2021-10-02 ENCOUNTER — Telehealth (HOSPITAL_COMMUNITY): Payer: Self-pay | Admitting: *Deleted

## 2021-10-02 NOTE — Telephone Encounter (Signed)
Attempted hospital discharge follow-up call. Left message for patient to return RN call. Deforest Hoyles, RN, 10/02/21, 1955 ?

## 2021-10-29 DIAGNOSIS — O24430 Gestational diabetes mellitus in the puerperium, diet controlled: Secondary | ICD-10-CM | POA: Diagnosis not present

## 2021-10-29 DIAGNOSIS — O24410 Gestational diabetes mellitus in pregnancy, diet controlled: Secondary | ICD-10-CM | POA: Diagnosis not present

## 2021-12-02 DIAGNOSIS — J020 Streptococcal pharyngitis: Secondary | ICD-10-CM | POA: Diagnosis not present

## 2022-03-04 DIAGNOSIS — L02511 Cutaneous abscess of right hand: Secondary | ICD-10-CM | POA: Diagnosis not present

## 2022-09-28 ENCOUNTER — Ambulatory Visit
Admission: EM | Admit: 2022-09-28 | Discharge: 2022-09-28 | Disposition: A | Discharge status: Home / Self Care | Payer: BC Managed Care – PPO

## 2022-09-28 ENCOUNTER — Encounter: Payer: Self-pay | Admitting: *Deleted

## 2022-09-28 ENCOUNTER — Other Ambulatory Visit: Payer: Self-pay

## 2022-09-28 DIAGNOSIS — L02211 Cutaneous abscess of abdominal wall: Secondary | ICD-10-CM

## 2022-09-28 MED ORDER — SULFAMETHOXAZOLE-TRIMETHOPRIM 800-160 MG PO TABS: 1.0000 | ORAL_TABLET | Freq: Two times a day (BID) | ORAL | 0 refills | Status: AC

## 2022-09-28 NOTE — Discharge Instructions (Signed)
Continue warm compresses to the abscess as needed Start Bactrim twice daily for 10 days Please follow-up with your PCP if your symptoms or not improving Please go to the emergency room if you develop any worsening symptoms.  This includes but is not limited to fever, worsening pain or swelling of the abscess, or any new concerns that arise I hope you feel better soon!

## 2022-09-28 NOTE — ED Provider Notes (Signed)
[facility]    CSN: [id number] Arrival date & time: 09/28/22  1423      History   Chief Complaint Chief Complaint  Patient presents with   Abscess    HPI Jackie Watkins is a 22 y.o. female presents for evaluation of a skin abscess.  Patient reports 2 days ago she noticed a small area on her right abdomen that was tender and since then has worsened.  She endorses pain, firmness, swelling, warmth, redness.  Does endorse chills but no documented fevers.  No nausea or vomiting.  Does report a history of boils on her buttock area in the past but denies history of MRSA.  She denies any injury to the area.  She has been using warm compresses with minimal improvement.  She denies any other concerns at this time.   Abscess   Past Medical History:  Diagnosis Date   Asthma    Eczema    Gestational diabetes     Patient Active Problem List   Diagnosis Date Noted   Gestational diabetes, diet controlled 09/21/2021   Gestational diabetes 09/17/2021   BMI 40.0-44.9, adult 09/17/2021   Asthma 09/17/2021   Dysmenorrhea 01/10/2019   History of abnormal menstrual cycle 01/10/2019   Acanthosis 07/12/2018   Thyroid goiter 03/18/2018   Elevated testosterone level 06/10/2017    Past Surgical History:  Procedure Laterality Date   NO PAST SURGERIES      OB History     Gravida  1   Para  1   Term  1   Preterm      AB      Living  1      SAB      IAB      Ectopic      Multiple  0   Live Births  1            Home Medications    Prior to Admission medications   Medication Sig Start Date End Date Taking? Authorizing Provider  sulfamethoxazole-trimethoprim (BACTRIM DS) 800-160 MG tablet Take 1 tablet by mouth 2 (two) times daily for 10 days. 09/28/22 10/08/22 Yes Radford PaxMayer, Jodi R, NP  UNKNOWN TO PATIENT Oral contraceptives   Yes [provider]  albuterol (PROVENTIL HFA;VENTOLIN HFA) 108 (90 BASE) MCG/ACT inhaler Inhale 2 puffs into the lungs every 6  (six) hours as needed. Shortness of breath/wheezing     [provider]  ibuprofen (ADVIL) 600 MG tablet Take 1 tablet (600 mg total) by mouth every 6 (six) hours. 09/24/21   Steva Readyavies, Melissa, DO  montelukast (SINGULAIR) 10 MG tablet Take 10 mg by mouth at bedtime.    [provider]  Prenatal Vit-Fe Fumarate-FA (PRENATAL MULTIVITAMIN) TABS tablet Take 1 tablet by mouth daily at 12 noon.    [provider]    Family History Family History  Problem Relation Age of Onset   Hypertension Father    Diabetes Paternal Grandmother    Hypertension Paternal Grandmother    Hypertension Paternal Grandfather     Social History Social History   Tobacco Use   Smoking status: Former    Passive exposure: Yes   Smokeless tobacco: Never  Vaping Use   Vaping Use: Former   Quit date: 09/11/2020  Substance Use Topics   Alcohol use: Not Currently   Drug use: Yes    Types: Marijuana     Allergies   Patient has no known allergies.   Review of Systems Review of Systems  Skin:        Abdominal abscess     Physical Exam Triage Vital Signs ED Triage Vitals  Enc Vitals Group     BP 09/28/22 1439 134/82     Pulse Rate 09/28/22 1439 92     Resp 09/28/22 1439 17     Temp 09/28/22 1439 98.6 F (37 C)     Temp Source 09/28/22 1439 Oral     SpO2 09/28/22 1439 97 %     Weight --      Height --      Head Circumference --      Peak Flow --      Pain Score 09/28/22 1502 6     Pain Loc --      Pain Edu? --      Excl. in GC? --    No data found.  Updated Vital Signs BP 134/82 (BP Location: Left Arm)   Pulse 92   Temp 98.6 F (37 C) (Oral)   Resp 17   LMP 09/21/2022 (Exact Date)   SpO2 97%   Breastfeeding No   Visual Acuity Right Eye Distance:   Left Eye Distance:   Bilateral Distance:    Right Eye Near:   Left Eye Near:    Bilateral Near:     Physical Exam Vitals and nursing note reviewed.  Constitutional:      Appearance: Normal appearance.  HENT:      Head: Normocephalic and atraumatic.  Eyes:     Pupils: Pupils are equal, round, and reactive to light.  Cardiovascular:     Rate and Rhythm: Normal rate.  Pulmonary:     Effort: Pulmonary effort is normal.  Abdominal:    Skin:    General: Skin is warm and dry.  Neurological:     General: No focal deficit present.     Mental Status: She is alert and oriented to person, place, and time.  Psychiatric:        Mood and Affect: Mood normal.        Behavior: Behavior normal.      UC Treatments / Results  Labs (all labs ordered are listed, but only abnormal results are displayed) Labs Reviewed - No data to display  EKG   Radiology No results found.  Procedures Procedures (including critical care time)  Medications Ordered in UC Medications - No data to display  Initial Impression / Assessment and Plan / UC Course  I have reviewed the triage vital signs and the nursing notes.  Pertinent labs & imaging results that were available during my care of the patient were reviewed by me and considered in my medical decision making (see chart for details).     Reviewed exam and symptoms with patient.  Patient is afebrile and well-appearing in clinic I&D not indicated after exam.  Will start Bactrim and she will continue warm compresses She is to follow-up with her PCP if her symptoms do not improve Strict ER precautions reviewed and patient verbalized understanding Final Clinical Impressions(s) / UC Diagnoses   Final diagnoses:  Abscess of skin of abdomen     Discharge Instructions      Continue warm compresses to the abscess as needed Start Bactrim twice daily for 10 days Please follow-up with your PCP if your symptoms or not improving Please go to the emergency room if you develop any worsening symptoms.  This includes but is not limited to fever, worsening pain or swelling of the abscess, or any  new concerns that arise I hope you feel better soon!    ED  Prescriptions     Medication Sig Dispense Auth. Provider   sulfamethoxazole-trimethoprim (BACTRIM DS) 800-160 MG tablet Take 1 tablet by mouth 2 (two) times daily for 10 days. 20 tablet Radford Pax, NP      PDMP not reviewed this encounter.   Radford Pax, NP 09/28/22 1539

## 2022-09-28 NOTE — ED Triage Notes (Signed)
C/O abscess to right abdominal wall onset approx 2 days ago. Started with tactile fever and chills onset yesterday along with body aches, fatigue and nausea. Has been taking IBU - last dose @ 0600 today. Right abdominal lesion noted with surrounding erythema.

## 2022-10-07 ENCOUNTER — Telehealth: Payer: Self-pay

## 2022-10-07 NOTE — Telephone Encounter (Signed)
Pt called stating she has been taking antibiotics, abscess has drained, and she has noticed a hardened area under the initial site. Pt calling to inquire if this is WNL. Pt denies any current redness, drainage, or further s/s of infection. Advised pt that site was most likely still in the process of healing, and to return for further evaluation if hardened area grew in size or if she developed s/s of infection. Pt verbalized understanding.

## 2023-03-01 IMAGING — US US OB COMP LESS 14 WK
1 series · 14 of 28 positions shown · non-contrast
Comparison: None.

CLINICAL DATA: Thirteen weeks pregnant. Vaginal bleeding. Last
menstrual period 01/11/2021. Estimated due date 09/28/2021.
Gestational age by last menstrual. 13 weeks and 3 days. Quantitative
beta HCG [id number]

EXAM:
OBSTETRIC <14 WK ULTRASOUND
TECHNIQUE: Transabdominal ultrasound was performed for evaluation of the
gestation as well as the maternal uterus and adnexal regions.

[Series 1: us ob comp less 14 wk · 14 of 36 slices shown]
[im 2/36]
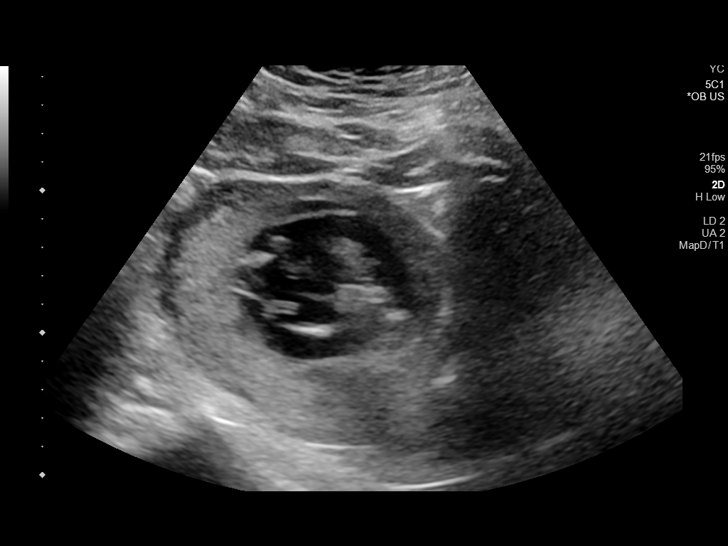
[im 4/36]
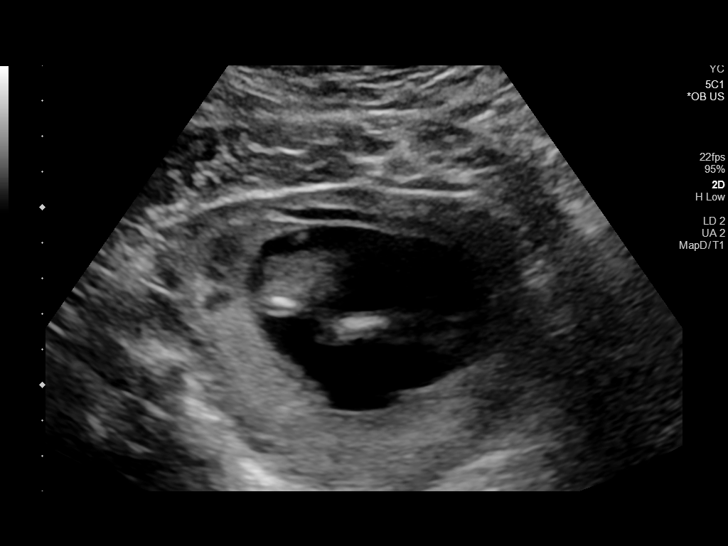
[im 7/36]
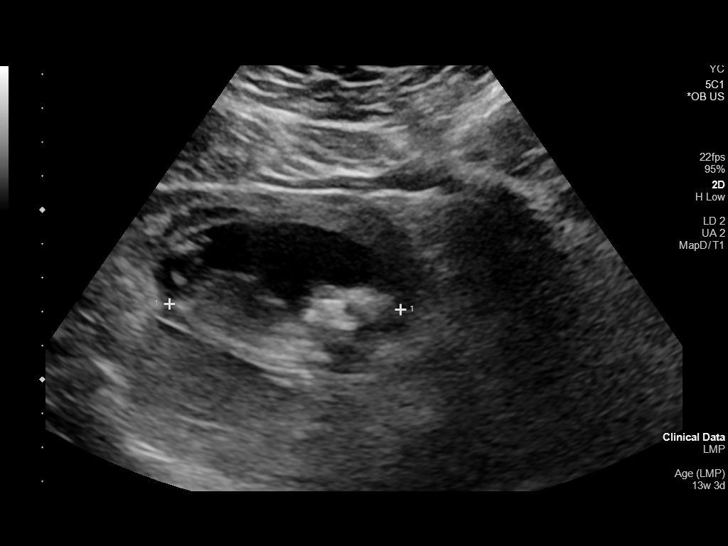
[im 10/36]
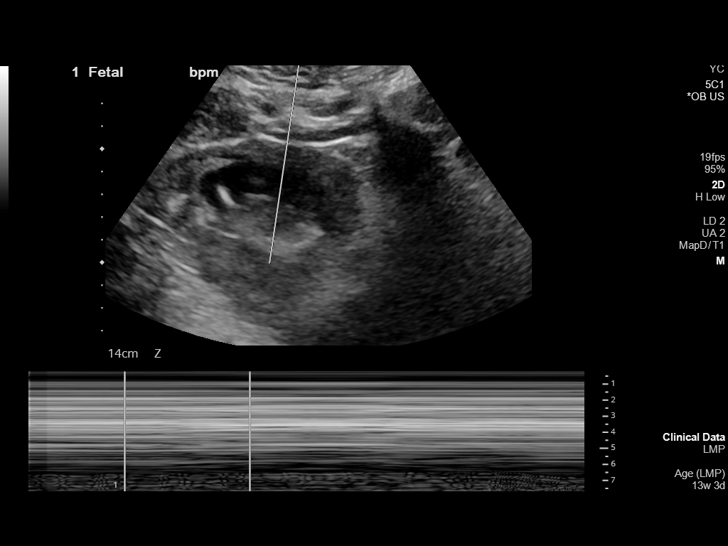
[im 12/36]
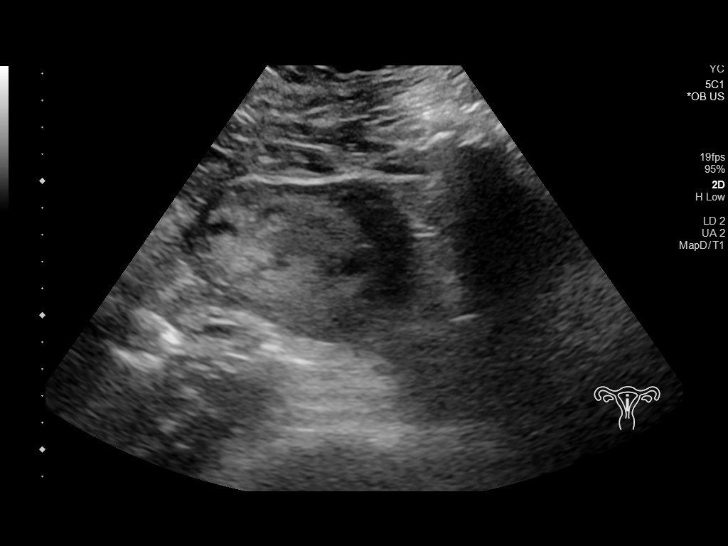
[im 15/36]
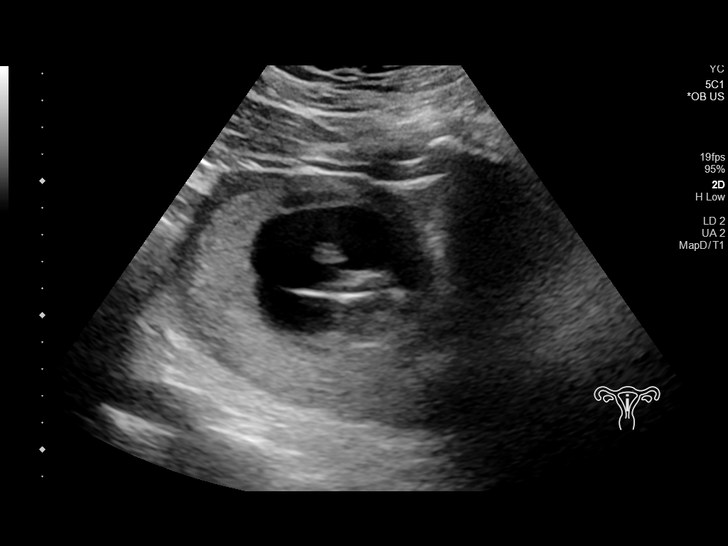
[im 17/36]
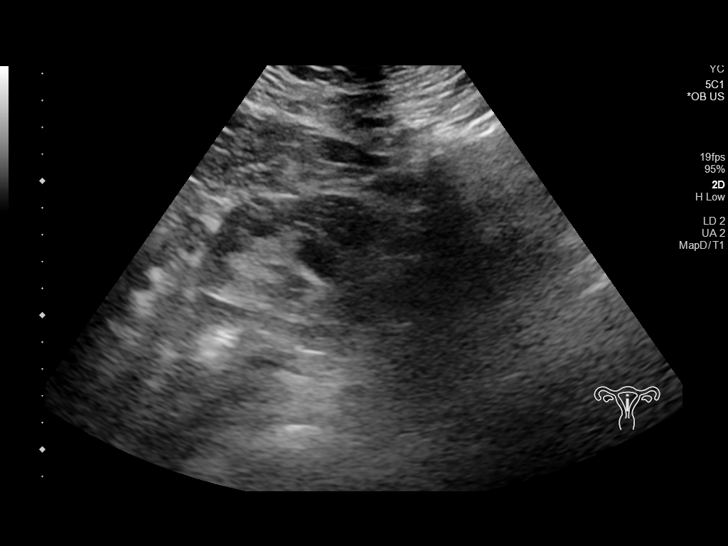
[im 20/36]
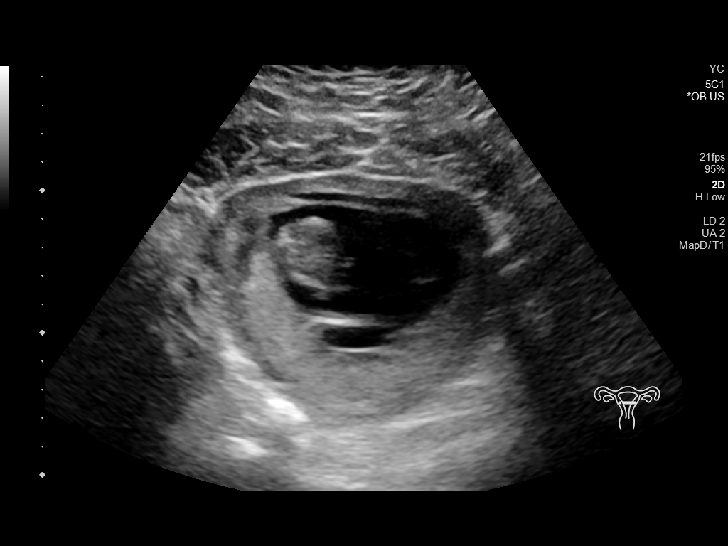
[im 23/36]
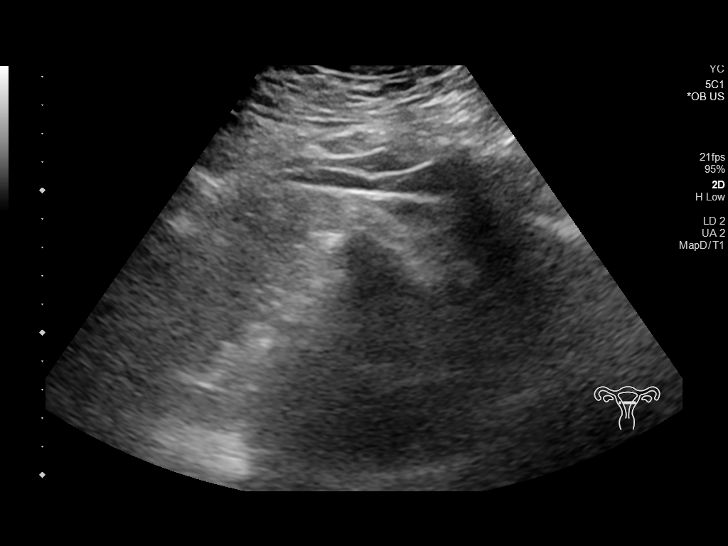
[im 25/36]
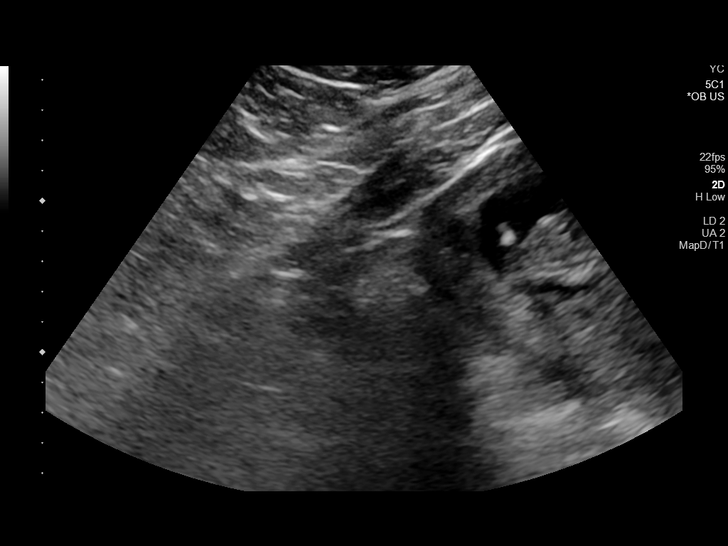
[im 28/36]
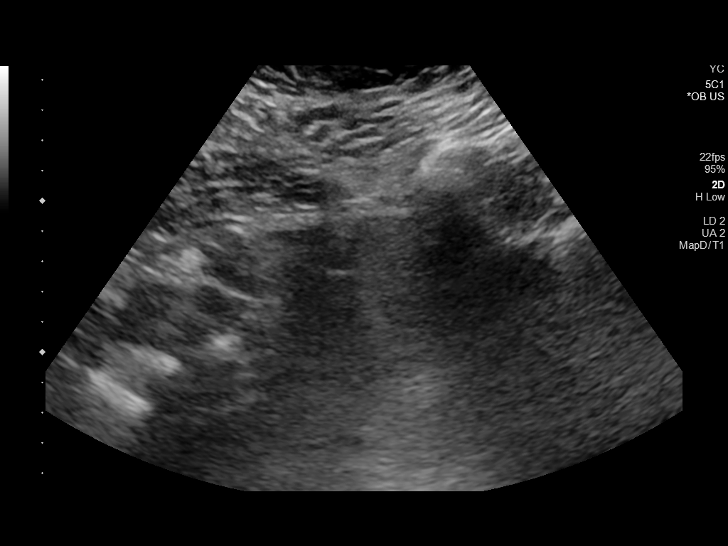
[im 30/36]
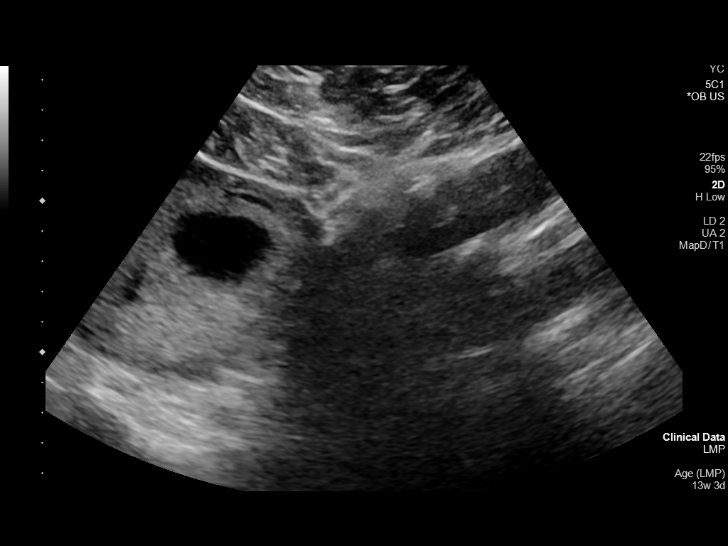
[im 33/36]
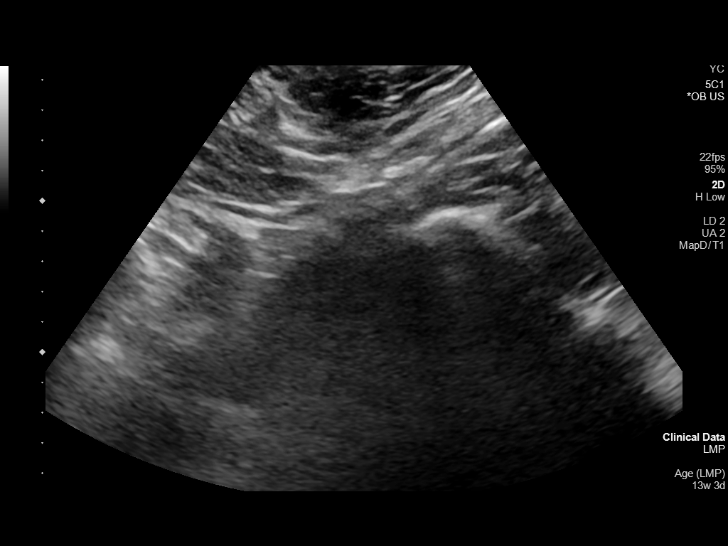
[im 36/36]
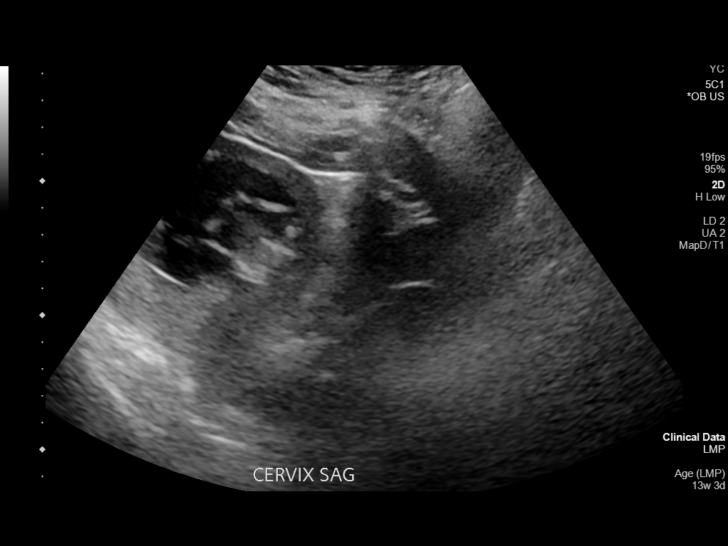

[14 of 28 positions shown; findings below may reference images not displayed]

FINDINGS: Intrauterine gestational sac: Single

Yolk sac:  Not Visualized.

Embryo:  Visualized.

Cardiac Activity: Visualized.

Heart Rate: 164 bpm

CRL:   68.1 mm   13 w 0 d                  US EDC: 10/01/2021

Subchorionic hemorrhage:  Small.

Maternal uterus/adnexae: Bilateral ovaries are not visualized.
Uterus is otherwise unremarkable.

Other: No free fluid within the pelvis.
IMPRESSION: 1. Single live intrauterine pregnancy with gestational age by
ultrasound of 13 weeks and 0 days which is concordant with
gestational age by last menstrual period of 13 weeks and 3 days.
2. Small subchorionic hemorrhage.

## 2023-03-03 DIAGNOSIS — Z6833 Body mass index (BMI) 33.0-33.9, adult: Secondary | ICD-10-CM | POA: Diagnosis not present

## 2023-03-03 DIAGNOSIS — Z1159 Encounter for screening for other viral diseases: Secondary | ICD-10-CM | POA: Diagnosis not present

## 2023-03-03 DIAGNOSIS — Z0000 Encounter for general adult medical examination without abnormal findings: Secondary | ICD-10-CM | POA: Diagnosis not present

## 2023-03-03 DIAGNOSIS — E559 Vitamin D deficiency, unspecified: Secondary | ICD-10-CM | POA: Diagnosis not present

## 2023-03-03 DIAGNOSIS — Z7251 High risk heterosexual behavior: Secondary | ICD-10-CM | POA: Diagnosis not present

## 2023-03-03 DIAGNOSIS — L309 Dermatitis, unspecified: Secondary | ICD-10-CM | POA: Diagnosis not present

## 2023-03-03 DIAGNOSIS — J4540 Moderate persistent asthma, uncomplicated: Secondary | ICD-10-CM | POA: Diagnosis not present

## 2023-03-03 DIAGNOSIS — Z3200 Encounter for pregnancy test, result unknown: Secondary | ICD-10-CM | POA: Diagnosis not present

## 2023-03-03 DIAGNOSIS — R5383 Other fatigue: Secondary | ICD-10-CM | POA: Diagnosis not present

## 2023-03-20 DIAGNOSIS — E559 Vitamin D deficiency, unspecified: Secondary | ICD-10-CM | POA: Diagnosis not present

## 2023-03-20 DIAGNOSIS — Z6841 Body Mass Index (BMI) 40.0 and over, adult: Secondary | ICD-10-CM | POA: Diagnosis not present

## 2023-05-26 DIAGNOSIS — H65192 Other acute nonsuppurative otitis media, left ear: Secondary | ICD-10-CM | POA: Diagnosis not present

## 2023-05-26 DIAGNOSIS — J028 Acute pharyngitis due to other specified organisms: Secondary | ICD-10-CM | POA: Diagnosis not present

## 2023-05-26 DIAGNOSIS — J00 Acute nasopharyngitis [common cold]: Secondary | ICD-10-CM | POA: Diagnosis not present

## 2024-04-16 ENCOUNTER — Emergency Department (HOSPITAL_COMMUNITY)
Admission: EM | Admit: 2024-04-16 | Discharge: 2024-04-16 | Disposition: A | Discharge status: Home / Self Care | Attending: Emergency Medicine | Admitting: Emergency Medicine

## 2024-04-16 ENCOUNTER — Emergency Department (HOSPITAL_COMMUNITY)

## 2024-04-16 ENCOUNTER — Encounter (HOSPITAL_COMMUNITY): Payer: Self-pay | Admitting: *Deleted

## 2024-04-16 ENCOUNTER — Other Ambulatory Visit: Payer: Self-pay

## 2024-04-16 DIAGNOSIS — O4690 Antepartum hemorrhage, unspecified, unspecified trimester: Secondary | ICD-10-CM

## 2024-04-16 DIAGNOSIS — O209 Hemorrhage in early pregnancy, unspecified: Secondary | ICD-10-CM | POA: Insufficient documentation

## 2024-04-16 DIAGNOSIS — Z3A01 Less than 8 weeks gestation of pregnancy: Secondary | ICD-10-CM | POA: Insufficient documentation

## 2024-04-16 HISTORY — DX: Depression, unspecified: F32.A

## 2024-04-16 LAB — COMPREHENSIVE METABOLIC PANEL WITH GFR
ALT: 13 U/L (ref 0–44)
AST: 22 U/L (ref 15–41)
Albumin: 4.7 g/dL (ref 3.5–5.0)
Alkaline Phosphatase: 86 U/L (ref 38–126)
Anion gap: 13 (ref 5–15)
BUN: 8 mg/dL (ref 6–20)
CO2: 22 mmol/L (ref 22–32)
Calcium: 10 mg/dL (ref 8.9–10.3)
Chloride: 104 mmol/L (ref 98–111)
Creatinine, Ser: 0.75 mg/dL (ref 0.44–1.00)
GFR, Estimated: >60 mL/min (ref >60)
Glucose, Bld: 92 mg/dL (ref 70–99)
Potassium: 3.8 mmol/L (ref 3.5–5.1)
Sodium: 138 mmol/L (ref 135–145)
Total Bilirubin: 0.4 mg/dL (ref 0.0–1.2)
Total Protein: 8.9 g/dL — ABNORMAL HIGH (ref 6.5–8.1)

## 2024-04-16 LAB — URINALYSIS, ROUTINE W REFLEX MICROSCOPIC
Bilirubin Urine: NEGATIVE
Glucose, UA: NEGATIVE mg/dL
Hgb urine dipstick: NEGATIVE
Ketones, ur: NEGATIVE mg/dL
Nitrite: NEGATIVE
Protein, ur: NEGATIVE mg/dL
Specific Gravity, Urine: 1.013 (ref 1.005–1.030)
pH: 6 (ref 5.0–8.0)

## 2024-04-16 LAB — CBC
HCT: 43.1 % (ref 36.0–46.0)
Hemoglobin: 12.8 g/dL (ref 12.0–15.0)
MCH: 22.8 pg — ABNORMAL LOW (ref 26.0–34.0)
MCHC: 29.7 g/dL — ABNORMAL LOW (ref 30.0–36.0)
MCV: 76.7 fL — ABNORMAL LOW (ref 80.0–100.0)
Platelets: 528 K/uL — ABNORMAL HIGH (ref 150–400)
RBC: 5.62 MIL/uL — ABNORMAL HIGH (ref 3.87–5.11)
RDW: 15.3 % (ref 11.5–15.5)
WBC: 11.5 K/uL — ABNORMAL HIGH (ref 4.0–10.5)
nRBC: 0 % (ref 0.0–0.2)

## 2024-04-16 LAB — HCG, QUANTITATIVE, PREGNANCY: hCG, Beta Chain, Quant, S: 544 m[IU]/mL — ABNORMAL HIGH (ref <5)

## 2024-04-16 LAB — HCG, SERUM, QUALITATIVE: Preg, Serum: POSITIVE — AB

## 2024-04-16 LAB — LIPASE, BLOOD: Lipase: 18 U/L (ref 11–51)

## 2024-04-16 NOTE — ED Provider Notes (Signed)
 [place] EMERGENCY DEPARTMENT AT [facility] Provider Note   CSN: [id number] Arrival date & time: 04/16/24  9173     Patient presents with: Vaginal Bleeding   Jackie Watkins is a 23 y.o. female who is G3, P1 Ab1 who presents to the emergency department today for further evaluation of vaginal bleeding.  Patient's last normal menstrual period was on September 15.  She is normally pretty regular.  Patient states she took a pregnancy test that she missed her menstrual cycle which was positive.  Couple of days ago she started having lower abdominal cramping, pain, and extensive vaginal bleeding.  She was bleeding through a pad an hour yesterday.  She states the bleeding has lightened up but she is still having lower abdominal cramping.  She denies any fever, chills, nausea, vomiting.    Vaginal Bleeding      Prior to Admission medications   Medication Sig Start Date End Date Taking? Authorizing Provider  albuterol  (PROVENTIL  HFA;VENTOLIN  HFA) 108 (90 BASE) MCG/ACT inhaler Inhale 2 puffs into the lungs every 6 (six) hours as needed. Shortness of breath/wheezing     [provider]  ibuprofen  (ADVIL ) 600 MG tablet Take 1 tablet (600 mg total) by mouth every 6 (six) hours. 09/24/21   Davies, Melissa, DO  montelukast (SINGULAIR) 10 MG tablet Take 10 mg by mouth at bedtime.    [provider]  Prenatal Vit-Fe Fumarate-FA (PRENATAL MULTIVITAMIN) TABS tablet Take 1 tablet by mouth daily at 12 noon.    [provider]  UNKNOWN TO PATIENT Oral contraceptives    [provider]    Allergies: Patient has no known allergies.    Review of Systems  Genitourinary:  Positive for vaginal bleeding.  All other systems reviewed and are negative.   Updated Vital Signs BP 122/60 (BP Location: Left Arm)   Pulse 75   Temp 98.1 F (36.7 C) (Oral)   Resp 16   Ht 5' 8 (1.727 m)   LMP 03/08/2024   SpO2 100%   BMI 46.56 kg/m   Physical Exam Vitals and  nursing note reviewed.  Constitutional:      General: She is not in acute distress.    Appearance: Normal appearance.  HENT:     Head: Normocephalic and atraumatic.  Eyes:     General:        Right eye: No discharge.        Left eye: No discharge.  Cardiovascular:     Comments: Regular rate and rhythm.  S1/S2 are distinct without any evidence of murmur, rubs, or gallops.  Radial pulses are 2+ bilaterally.  Dorsalis pedis pulses are 2+ bilaterally.  No evidence of pedal edema. Pulmonary:     Comments: Clear to auscultation bilaterally.  Normal effort.  No respiratory distress.  No evidence of wheezes, rales, or rhonchi heard throughout. Abdominal:     General: Abdomen is flat. Bowel sounds are normal. There is no distension.     Tenderness: There is no guarding or rebound.     Comments: Mild lower abdominal tenderness.  Musculoskeletal:        General: Normal range of motion.     Cervical back: Neck supple.  Skin:    General: Skin is warm and dry.     Findings: No rash.  Neurological:     General: No focal deficit present.     Mental Status: She is alert.  Psychiatric:        Mood and Affect: Mood  normal.        Behavior: Behavior normal.     (all labs ordered are listed, but only abnormal results are displayed) Labs Reviewed  COMPREHENSIVE METABOLIC PANEL WITH GFR - Abnormal; Notable for the following components:      Result Value   Total Protein 8.9 (*)    All other components within normal limits  CBC - Abnormal; Notable for the following components:   WBC 11.5 (*)    RBC 5.62 (*)    MCV 76.7 (*)    MCH 22.8 (*)    MCHC 29.7 (*)    Platelets 528 (*)    All other components within normal limits  URINALYSIS, ROUTINE W REFLEX MICROSCOPIC - Abnormal; Notable for the following components:   APPearance HAZY (*)    Leukocytes,Ua MODERATE (*)    Bacteria, UA RARE (*)    All other components within normal limits  HCG, SERUM, QUALITATIVE - Abnormal; Notable for the  following components:   Preg, Serum POSITIVE (*)    All other components within normal limits  HCG, QUANTITATIVE, PREGNANCY - Abnormal; Notable for the following components:   hCG, Beta Chain, Quant, S 544 (*)    All other components within normal limits  LIPASE, BLOOD  ABO/RH    EKG: None  Radiology: US  OB LESS THAN 14 WEEKS WITH OB TRANSVAGINAL Result Date: 04/16/2024 EXAM: OBSTETRIC ULTRASOUND FIRST TRIMESTER CLINICAL HISTORY: Vaginal bleeding in pregnancy [id number]. Vaginal bleeding in pregnancy TECHNIQUE: Transabdominal first trimester obstetric pelvic duplex ultrasound was performed with real-time imaging, color flow Doppler imaging, and spectral analysis. COMPARISON: None provided. FINDINGS: UTERUS: Single tiny cystic structure within the endometrium measuring 1.1 cm equivocal for early gestational sac. No embryo or yolk sac identified. No focal myometrial mass. GESTATIONAL SAC(S): Equivocal for early gestational sac measuring 1.1 cm. No subchorionic hemorrhage. YOLK SAC: Not identified. EMBRYO(<11WK) /FETUS(>=11WK): Not identified. CROWN RUMP LENGTH: Not measured. RATE OF CARDIAC ACTIVITY: Not measured. RIGHT OVARY: Unremarkable. Normal arterial and venous flow. LEFT OVARY: Unremarkable. Normal arterial and venous flow. FREE FLUID: Trace free fluid noted within the pelvis. MEASUREMENTS ESTIMATED GESTATIONAL AGE BY CURRENT ULTRASOUND: Not determined due to equivocal findings. ESTIMATED GESTATIONAL AGE BY LMP/PRIOR ULTRASOUND: Not provided. ESTIMATED DUE DATE: Not determined due to equivocal findings. IMPRESSION: 1. Equivocal early intrauterine fluid collection measuring 1.1 cm without visible yolk sac or embryo. 2. Recommend follow up serial beta hcg and follow-up ultrasound in 7 to 10 days. 3. Trace free fluid noted within the pelvis. Electronically signed by: Waddell Calk MD 04/16/2024 12:37 PM EDT RP Workstation: [id number]     Procedures   Medications Ordered in the ED - No data to  display  Clinical Course as of 04/16/24 1307  Sat Apr 16, 2024  1127 US  OB Comp Less 14 Wks [[place]]  1158 US  OB LESS THAN 14 WEEKS WITH OB TRANSVAGINAL [[place]]    Clinical Course User Index [[place]] Jackie Watkins, Student-PA    Medical Decision Making Jackie Watkins is a 23 y.o. female patient who presents to the emergency department today for further evaluation of vaginal bleeding in the setting of pregnancy. This pregnant patient presents with vaginal bleeding in the first trimester. Differential includes ectopic, IUP, threatened/inevitable abortion, along with completed abortion. Patient without a history of coagulopathy or infectious symptoms. Doubt alternate acute emergent pathology.  Patient with TVUS did not show obvious yolk sac or embryo.  hCG at the level of approximately 2 weeks per the patient is 5 weeks by dates based  on her last normal menstrual period and app tracking.  Obviously concern for nonviable pregnancy.  Patient otherwise stable.  Patient does not need emergent blood products at this time.  Will send her to OB/GYN for further evaluation and repeat blood work and ultrasound.  Amount and/or Complexity of Data Reviewed Labs: ordered. Radiology: ordered.     Final diagnoses:  Vaginal bleeding in pregnancy    ED Discharge Orders     None          Theotis Cameron HERO, [place] 04/16/24 1307    Laurice Maude BROCKS, MD 04/16/24 1350

## 2024-04-16 NOTE — ED Notes (Signed)
 Unable to obtain blood work for ABO/RH, pt removed her IV access. Pt in distress and crying, states she wants to go home. Pt not cooperative at this moment for any interventions. Preparing to discharge

## 2024-04-16 NOTE — ED Triage Notes (Signed)
 Pt reports last period started Sept 16th. She took a home preg test Oct 29 (+), cramping for 2 days then bleeding last night (more than normal period)

## 2024-04-16 NOTE — Discharge Instructions (Signed)
 As we discussed, it is unclear whether or not this is a viable or nonviable pregnancy.  Obviously the concern is that this is not viable given that your app is showing 5 weeks and the beta-hCG level is showing about 2 weeks with no viable embryo or yolk sac found on ultrasound.  I would like for you to call your OB/GYN on Monday to get repeat blood work next week and possibly a repeat ultrasound as well.  You may return to the emergency department for any worsening symptoms.

## 2024-04-18 ENCOUNTER — Inpatient Hospital Stay (HOSPITAL_COMMUNITY)
Admission: AD | Admit: 2024-04-18 | Discharge: 2024-04-18 | Disposition: A | Discharge status: Home / Self Care | Attending: Obstetrics and Gynecology | Admitting: Obstetrics and Gynecology

## 2024-04-18 ENCOUNTER — Other Ambulatory Visit: Payer: Self-pay | Admitting: Certified Nurse Midwife

## 2024-04-18 ENCOUNTER — Encounter (HOSPITAL_COMMUNITY): Payer: Self-pay | Admitting: *Deleted

## 2024-04-18 DIAGNOSIS — O039 Complete or unspecified spontaneous abortion without complication: Secondary | ICD-10-CM

## 2024-04-18 HISTORY — DX: Anxiety disorder, unspecified: F41.9

## 2024-04-18 HISTORY — DX: Obsessive-compulsive disorder, unspecified: F42.9

## 2024-04-18 LAB — HCG, QUANTITATIVE, PREGNANCY: hCG, Beta Chain, Quant, S: 509 m[IU]/mL — ABNORMAL HIGH (ref <5)

## 2024-04-18 NOTE — Discharge Instructions (Addendum)
 You have experienced a miscarriage at 5 weeks of pregnancy. This is a common event, and most people recover well physically.  Miscarriage can be emotionally difficult.  Support from family, friends, or counseling may help.  This is not your fault, and most people go on to have healthy pregnancies in the future. Here are important instructions to help you take care of yourself as you heal:  What to expect: You may have vaginal bleeding for up to 2 weeks.  It may be heavier than a normal period at first, then gradually lighten. Cramping is common and can be managed with over-the-counter pain medication like ibuprofen , unless you have a reason not to use it. Passing small clots or tissue is normal.  Self-care: Rest as needed.  You may return to normal activities when you feel ready. You can shower or bathe as usual. Avoid sexual intercourse, tampons, and douching until bleeding stops to reduce infection risk. Eat a healthy diet and drink plenty of fluids.  Contact a health care provider if: You have a fever or chills. There's bad-smelling fluid coming from your vagina. You have more bleeding instead of less. More tissue or blood clots come out of your vagina than you were told to expect. You become light-headed or weak.  Get help right away if: Heavy bleeding soaks through 2 large pads an hour for more than 2 hours. You faint. You feel sad all the time. You think about hurting yourself.  These symptoms may be an emergency. Take one of these steps right away: Go to your nearest emergency room. Call 911. Call the Suicide & Crisis Lifeline (free and confidential): Call (586)495-4757 or 988. Text 647-316-2671.

## 2024-04-18 NOTE — MAU Note (Signed)
 Jackie Watkins is a 23 y.o. at Unknown here in MAU reporting: was seen 10/25 for +preg with cramping and bleeding.  HCG was 544.  Was told  to f/u with OB, told to come here. Feels good.  Mild cramping. No longer spotting.  LMP: 9/16 Onset of complaint: ongoing Pain score: mild Vitals:   04/18/24 1012  BP: 135/66  Pulse: 71  Resp: 18  Temp: 98.7 F (37.1 C)  SpO2: 100%      Lab orders placed from triage:  HCG

## 2024-04-18 NOTE — MAU Provider Note (Cosign Needed Addendum)
 Chief Complaint:  Follow-up   HPI  Jackie Watkins is a 23 y.o. G2P1001 at [redacted]w[redacted]d who presents to maternity admissions for b-HCG follow up. Patient was seen 10/25 for abdominal cramping and vaginal bleeding with a b-HCG of 544. She was told to follow up with her OBGYN who advised her to come here for reevaluation of b-HCG. She states the cramping is mild and bleeding have resolved.    Past Medical History:  Diagnosis Date   Anxiety    Asthma    Depression    Eczema    Gestational diabetes    OCD (obsessive compulsive disorder)    OB History  Gravida Para Term Preterm AB Living  2 1 1   1   SAB IAB Ectopic Multiple Live Births     0 1    # Outcome Date GA Lbr Len/2nd Weight Sex Type Anes PTL Lv  2 Current           1 Term 09/22/21 [redacted]w[redacted]d / 00:32 2820 g M Vag-Spont EPI  LIV   Past Surgical History:  Procedure Laterality Date   NO PAST SURGERIES     Family History  Problem Relation Age of Onset   Healthy Mother    Diabetes Father    Hypertension Father    Diabetes Paternal Grandmother    Hypertension Paternal Grandmother    Hypertension Paternal Grandfather    Social History   Tobacco Use   Smoking status: Never    Passive exposure: Yes   Smokeless tobacco: Never  Vaping Use   Vaping status: Former   Quit date: 09/11/2020  Substance Use Topics   Alcohol use: Not Currently   Drug use: Not Currently    Types: Marijuana    Comment: prior to +preg test   No Known Allergies No medications prior to admission.    I have reviewed patient's Past Medical Hx, Surgical Hx, Family Hx, Social Hx, medications and allergies.   ROS  Pertinent items noted in HPI and remainder of comprehensive ROS otherwise negative.   PHYSICAL EXAM  Patient Vitals for the past 24 hrs:  BP Temp Temp src Pulse Resp SpO2 Height Weight  04/18/24 1012 135/66 98.7 F (37.1 C) Oral 71 18 100 % 5' 8 (1.727 m) (!) 142.7 kg    Constitutional: Well-developed, well-nourished female in no acute distress.   HEENT: No sign of trauma, EOM grossly intact Respiratory: normal WOB GI: non-distended  Extremities: no peripheral edema. Neuro: Normal gait, moves all four extremities appropriately Skin: no lesions/rashes visualized Psych: Appropriate mood and affect  Labs: Results for orders placed or performed during the hospital encounter of 04/18/24 (from the past 24 hours)  hCG, quantitative, pregnancy     Status: Abnormal   Collection Time: 04/18/24 10:36 AM  Result Value Ref Range   hCG, Beta Chain, Quant, S 509 (H) <5 mIU/mL    MDM & MAU COURSE  MDM:  low  Pregnancy of unknown location with decrease in HCG.  Consulted Dr. Izell  MAU Course: Orders Placed This Encounter  Procedures   hCG, quantitative, pregnancy   Discharge patient   ASSESSMENT   1. Spontaneous abortion   Failed pregnancy per decrease in b-HCG. Discussed Cytotec  today vs in office follow up and recheck. Patient would like to have an office follow up instead. Scheduled follow up at [facility] at 11am.   PLAN  Discharge home in stable condition with return precautions and what to expect.   Return to MAU PRN.  Follow up at [facility] lab 04/20/24 at 1100.   Allergies as of 04/18/2024   No Known Allergies      Medication List     TAKE these medications    albuterol  108 (90 Base) MCG/ACT inhaler Commonly known as: VENTOLIN  HFA Inhale 2 puffs into the lungs every 6 (six) hours as needed. Shortness of breath/wheezing   ibuprofen  600 MG tablet Commonly known as: ADVIL  Take 1 tablet (600 mg total) by mouth every 6 (six) hours.   montelukast 10 MG tablet Commonly known as: SINGULAIR Take 10 mg by mouth at bedtime.   prenatal multivitamin Tabs tablet Take 1 tablet by mouth daily at 12 noon.   UNKNOWN TO PATIENT Oral contraceptives       Jackie Dixons, DO PGY-1 Family Medicine Resident [facility]  Attestation of Supervision of Student:  I confirm that I have verified the information  documented in the medical resident's note and that I have also personally supervised the history, physical exam and all medical decision making activities.  I have verified that all services and findings are accurately documented in this student's note; and I agree with management and plan as outlined in the documentation. I have also made any necessary editorial changes.  Jackie Watkins, CNM [facility] [employer], [facility] 04/18/2024 2:32 PM

## 2024-04-21 ENCOUNTER — Ambulatory Visit: Payer: Self-pay | Admitting: Family Medicine

## 2024-04-21 ENCOUNTER — Other Ambulatory Visit: Payer: Self-pay

## 2024-04-21 ENCOUNTER — Other Ambulatory Visit (INDEPENDENT_AMBULATORY_CARE_PROVIDER_SITE_OTHER): Payer: Self-pay

## 2024-04-21 VITALS — BP 135/71 | HR 78 | Ht 68.0 in | Wt 320.6 lb

## 2024-04-21 DIAGNOSIS — Z3A01 Less than 8 weeks gestation of pregnancy: Secondary | ICD-10-CM

## 2024-04-21 DIAGNOSIS — O039 Complete or unspecified spontaneous abortion without complication: Secondary | ICD-10-CM

## 2024-04-21 DIAGNOSIS — O3680X0 Pregnancy with inconclusive fetal viability, not applicable or unspecified: Secondary | ICD-10-CM

## 2024-04-21 LAB — BETA HCG QUANT (REF LAB): hCG Quant: 311 m[IU]/mL

## 2024-04-21 NOTE — Progress Notes (Signed)
 Results reviewed from Dr. Lola -downtrending c/w previously diagnosed SAB Clinical staff please coordinate follow up hcg in one week, trend to 0.  Pt notified of results and reasons to go to MAU for evaluation.  Pt agreed to non stat beta's on 04/28/24 and 05/05/24 at 0940.    Tyee Vandevoorde,RN  04/21/24

## 2024-04-21 NOTE — Progress Notes (Signed)
 Here for stat bhcg. Denies pain. Denies bleeding. Explained we will draw stat bhcg and have her leave office. She will be called with results in several hours after results received and reviewed by provider. She voices understanding.   Rock Skip PEAK

## 2024-04-28 ENCOUNTER — Other Ambulatory Visit

## 2024-05-05 ENCOUNTER — Other Ambulatory Visit

## 2024-07-04 ENCOUNTER — Other Ambulatory Visit: Payer: Self-pay

## 2024-07-04 ENCOUNTER — Encounter (HOSPITAL_COMMUNITY): Payer: Self-pay

## 2024-07-04 ENCOUNTER — Emergency Department (HOSPITAL_COMMUNITY)
Admission: EM | Admit: 2024-07-04 | Discharge: 2024-07-04 | Disposition: A | Discharge status: Home / Self Care | Attending: Emergency Medicine | Admitting: Emergency Medicine

## 2024-07-04 DIAGNOSIS — K0889 Other specified disorders of teeth and supporting structures: Secondary | ICD-10-CM | POA: Diagnosis present

## 2024-07-04 DIAGNOSIS — K029 Dental caries, unspecified: Secondary | ICD-10-CM | POA: Diagnosis not present

## 2024-07-04 MED ORDER — LIDOCAINE VISCOUS HCL 2 % MT SOLN
15.0000 mL | Freq: Once | OROMUCOSAL | Status: AC
  Administered 2024-07-04: 15 mL via OROMUCOSAL
  Filled 2024-07-04: qty 15

## 2024-07-04 MED ORDER — PENICILLIN V POTASSIUM 500 MG PO TABS
500.0000 mg | ORAL_TABLET | Freq: Once | ORAL | Status: AC
  Administered 2024-07-04: 500 mg via ORAL
  Filled 2024-07-04: qty 1

## 2024-07-04 MED ORDER — LIDOCAINE VISCOUS HCL 2 % MT SOLN: 5.0000 mL | Freq: Three times a day (TID) | OROMUCOSAL | 0 refills | Status: AC | PRN

## 2024-07-04 MED ORDER — PENICILLIN V POTASSIUM 500 MG PO TABS: 500.0000 mg | ORAL_TABLET | Freq: Four times a day (QID) | ORAL | 0 refills | Status: AC

## 2024-07-04 NOTE — ED Provider Notes (Signed)
 " [place] EMERGENCY DEPARTMENT AT [facility] Provider Note   CSN: [id number] Arrival date & time: 07/04/24  0410     Patient presents with: Dental Pain   Jackie Watkins is a 24 y.o. female.   The history is provided by the patient.  Dental Pain Location:  Lower Lower teeth location:  30/RL 1st molar Quality:  Aching Severity:  Severe Onset quality:  Gradual Timing:  Constant Chronicity:  Recurrent Context: dental caries, filling fell out and poor dentition   Previous work-up:  Dental exam Relieved by:  Nothing Worsened by:  Nothing Associated symptoms: no oral bleeding, no oral lesions and no trismus   Risk factors: no diabetes        Prior to Admission medications  Medication Sig Start Date End Date Taking? Authorizing Provider  magic mouthwash (lidocaine , diphenhydrAMINE , alum & mag hydroxide) suspension Swish and spit 5 mLs 3 (three) times daily as needed for mouth pain. 07/04/24  Yes Jarick Harkins, MD  penicillin  v potassium (VEETID) 500 MG tablet Take 1 tablet (500 mg total) by mouth 4 (four) times daily for 7 days. 07/04/24 07/11/24 Yes Dhyan Noah, MD  albuterol  (PROVENTIL  HFA;VENTOLIN  HFA) 108 (90 BASE) MCG/ACT inhaler Inhale 2 puffs into the lungs every 6 (six) hours as needed. Shortness of breath/wheezing     [provider]  ibuprofen  (ADVIL ) 600 MG tablet Take 1 tablet (600 mg total) by mouth every 6 (six) hours. Patient not taking: Reported on 04/21/2024 09/24/21   Davies, Melissa, DO  loratadine (CLARITIN) 10 MG tablet Take 10 mg by mouth daily as needed. 12/28/20   [provider]  montelukast (SINGULAIR) 10 MG tablet Take 10 mg by mouth at bedtime.    [provider]  PARoxetine (PAXIL) 20 MG tablet Take 20 mg by mouth daily. 03/01/24   [provider]  Prenatal Vit-Fe Fumarate-FA (PRENATAL MULTIVITAMIN) TABS tablet Take 1 tablet by mouth daily at 12 noon.    [provider]  UNKNOWN TO PATIENT Oral  contraceptives    [provider]    Allergies: Patient has no known allergies.    Review of Systems  HENT:  Positive for dental problem. Negative for mouth sores.   All other systems reviewed and are negative.   Updated Vital Signs BP 130/61   Pulse 78   Temp 97.6 F (36.4 C) (Oral)   Resp 16   LMP 03/08/2024   SpO2 97%   Physical Exam Vitals and nursing note reviewed.  Constitutional:      General: She is not in acute distress.    Appearance: Normal appearance. She is well-developed.  HENT:     Head: Normocephalic and atraumatic.     Nose: Nose normal.     Mouth/Throat:     Mouth: Mucous membranes are moist.     Pharynx: Oropharynx is clear.  Eyes:     Pupils: Pupils are equal, round, and reactive to light.  Cardiovascular:     Rate and Rhythm: Normal rate and regular rhythm.     Pulses: Normal pulses.     Heart sounds: Normal heart sounds.  Pulmonary:     Effort: Pulmonary effort is normal. No respiratory distress.     Breath sounds: Normal breath sounds.  Abdominal:     General: Bowel sounds are normal. There is no distension.     Palpations: Abdomen is soft.     Tenderness: There is no abdominal tenderness. There is no guarding or rebound.  Genitourinary:    Vagina: No vaginal discharge.  Musculoskeletal:        General: Normal range of motion.     Cervical back: Neck supple.  Skin:    General: Skin is dry.     Capillary Refill: Capillary refill takes less than 2 seconds.     Findings: No erythema or rash.  Neurological:     General: No focal deficit present.     Mental Status: She is alert.     Deep Tendon Reflexes: Reflexes normal.  Psychiatric:        Mood and Affect: Mood normal.     (all labs ordered are listed, but only abnormal results are displayed) Labs Reviewed - No data to display  EKG: None  Radiology: No results found.   Procedures   Medications Ordered in the ED  lidocaine  (XYLOCAINE ) 2 % viscous mouth solution 15  mL (has no administration in time range)  penicillin  v potassium (VEETID) tablet 500 mg (500 mg Oral Given 07/04/24 0422)                                    Medical Decision Making Dental caries missed last dentist appointment   Amount and/or Complexity of Data Reviewed External Data Reviewed: notes.    Details: Previous notes reviewed   Risk Prescription drug management. Risk Details: Patient with a known chip in tooth missed last appointment.  Will start antibiotics.  Will need to follow up.       Final diagnoses:  Dental caries   No signs of systemic illness or infection. The patient is nontoxic-appearing on exam and vital signs are within normal limits.  I have reviewed the triage vital signs and the nursing notes. Pertinent labs & imaging results that were available during my care of the patient were reviewed by me and considered in my medical decision making (see chart for details). After history, exam, and medical workup I feel the patient has been appropriately medically screened and is safe for discharge home. Pertinent diagnoses were discussed with the patient. Patient was given return precautions.    ED Discharge Orders          Ordered    penicillin  v potassium (VEETID) 500 MG tablet  4 times daily        07/04/24 0423    magic mouthwash (lidocaine , diphenhydrAMINE , alum & mag hydroxide) suspension  3 times daily PRN        07/04/24 0426               Saburo Luger, MD 07/04/24 0444  "

## 2024-07-04 NOTE — ED Triage Notes (Signed)
 Pt. Arrives for dental pain to the bottom right side. Pt. States that she chipped a filling. States that she has seen a dentist, but they said that it was fine. States that she has another appointment Wednesday, but is in too much pain.
